# Patient Record
Sex: Male | Born: 1960 | Race: Black or African American | Hispanic: No | Marital: Single | Smoking: Former smoker
Health system: Southern US, Community
[De-identification: ages and names within clinical notes are randomized; demographics above are authoritative.]

## PROBLEM LIST (undated history)

## (undated) DIAGNOSIS — J309 Allergic rhinitis, unspecified: Secondary | ICD-10-CM

## (undated) DIAGNOSIS — I10 Essential (primary) hypertension: Secondary | ICD-10-CM

## (undated) HISTORY — DX: Essential (primary) hypertension: I10

## (undated) HISTORY — DX: Allergic rhinitis, unspecified: J30.9

---

## 2005-05-28 ENCOUNTER — Inpatient Hospital Stay: Payer: Self-pay | Admitting: Otolaryngology

## 2020-12-26 DIAGNOSIS — Z23 Encounter for immunization: Secondary | ICD-10-CM | POA: Diagnosis not present

## 2021-02-13 ENCOUNTER — Ambulatory Visit: Payer: Self-pay | Admitting: Internal Medicine

## 2021-02-13 ENCOUNTER — Encounter: Payer: Self-pay | Admitting: Internal Medicine

## 2021-02-13 VITALS — BP 164/90 | HR 103 | Temp 97.4°F | Resp 18 | Ht 71.0 in | Wt 139.5 lb

## 2021-02-13 DIAGNOSIS — R0602 Shortness of breath: Secondary | ICD-10-CM | POA: Insufficient documentation

## 2021-02-13 DIAGNOSIS — Z87891 Personal history of nicotine dependence: Secondary | ICD-10-CM

## 2021-02-13 DIAGNOSIS — J3489 Other specified disorders of nose and nasal sinuses: Secondary | ICD-10-CM

## 2021-02-13 DIAGNOSIS — R030 Elevated blood-pressure reading, without diagnosis of hypertension: Secondary | ICD-10-CM

## 2021-02-13 MED ORDER — ALBUTEROL SULFATE HFA 108 (90 BASE) MCG/ACT IN AERS: 2.0000 | INHALATION_SPRAY | Freq: Four times a day (QID) | RESPIRATORY_TRACT | 0 refills | Status: DC | PRN

## 2021-02-13 MED ORDER — FLUTICASONE PROPIONATE 50 MCG/ACT NA SUSP: 2.0000 | Freq: Every day | NASAL | 6 refills | Status: AC

## 2021-02-13 NOTE — Patient Instructions (Addendum)
It was great seeing you today!  Plan discussed at today's visit: -For nasal drainage, use Flonase (nasal steroid) two sprays in each nostril twice a day after using nasal saline -Albuterol inhaler sent to pharmacy to use as needed for wheezing or short of breath  -Referral placed to lung doctor for pulmonary function tests to rule out COPD/asthma  Follow up in: 1 month for physical and blood work  Take care and let us know if you have any questions or concerns prior to your next visit.  Dr. Caralee Ates

## 2021-02-13 NOTE — Progress Notes (Signed)
New Patient Office Visit  Subjective:  Patient ID: Dakota Blackwell, male    DOB: 02-Sep-1960  Age: 61 y.o. MRN: [id number]  CC:  Chief Complaint  Patient presents with   Establish Care   Shortness of Breath    Former smoker, off and on for several months   URI    Runny nose, blowing green out    HPI Dakota Blackwell presents as a new patient. No past medical history and no daily medications. 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 Did do PFTs in the past and was given an inhaler, but says was not diagnosed with a pulmonary disease - uncertain where or when this happened and no records in chart. Used to smoke 1ppd x 10 years, quit within the last few years. No diagnosis of COPD/asthma   Health Maintenance: -Blood work: due -Colon cancer screening: due   History reviewed. No pertinent past medical history.  History  reviewed. No pertinent surgical history.  Family History  Problem Relation Age of Onset   COPD Father    Cancer Father        bone    Social History   Socioeconomic History   Marital status: Single    Spouse name: Not on file   Number of children: Not on file   Years of education: Not on file   Highest education level: Not on file  Occupational History   Not on file  Tobacco Use   Smoking status: Former    Types: Cigarettes   Smokeless tobacco: Never  Vaping Use   Vaping Use: Never used  Substance and Sexual Activity   Alcohol use: Never   Drug use: Never   Sexual activity: Not on file  Other Topics Concern   Not on file  Social History Narrative   Not on file   Social Determinants of Health   Financial Resource Strain: Not on file  Food Insecurity: Not on file  Transportation Needs: Not on file  Physical Activity: Not on file  Stress: Not on file  Social Connections: Not on file  Intimate Partner Violence: Not on file    ROS Review of Systems  Constitutional:  Positive for appetite change. Negative for chills and fever.  HENT:  Positive for rhinorrhea. Negative for congestion, postnasal drip, sinus pressure, sinus pain, sneezing and sore throat.   Respiratory:  Positive for shortness of breath. Negative for cough and wheezing.   Cardiovascular:  Negative for chest pain, palpitations and leg swelling.  Gastrointestinal:  Negative for abdominal  pain, nausea and vomiting.  Skin: Negative.   Neurological:  Negative for dizziness and headaches.   Objective:   Today's Vitals: BP (!) 164/90    Pulse (!) 103    Temp (!) 97.4 F (36.3 C)    Resp 18    Ht 5\' 11"  (1.803 m)    Wt 139 lb 8 oz (63.3 kg)    SpO2 95%    BMI 19.46 kg/m   Physical Exam Constitutional:      Appearance: Normal appearance.  HENT:     Head: Normocephalic and atraumatic.     Nose: Rhinorrhea present.     Comments: Inferior turbinates swollen bilaterally     Mouth/Throat:     Mouth:  Mucous membranes are moist.     Comments: PND present Eyes:     Conjunctiva/sclera: Conjunctivae normal.  Cardiovascular:     Rate and Rhythm: Normal rate and regular rhythm.  Pulmonary:     Effort: Pulmonary effort is normal.     Comments: Mild inspiratory wheezes at the bilateral bases Abdominal:     General: There is no distension.     Palpations: Abdomen is soft.     Tenderness: There is no abdominal tenderness.  Musculoskeletal:     Right lower leg: No edema.     Left lower leg: No edema.  Skin:    General: Skin is warm and dry.  Neurological:     General: No focal deficit present.     Mental Status: He is alert. Mental status is at baseline.  Psychiatric:        Mood and Affect: Mood normal.        Behavior: Behavior normal.    Assessment & Plan:   1. Shortness of breath/History of tobacco use: Concerned for COPD/asthma. Referral placed for PFTs, in the meantime will sent Albuterol inhaler to use as needed for wheezing/shortness of breath. Due to his insurance, it will be costly for him to have blood work today, he will follow up  in 1 month for annual physical and blood work.   - Ambulatory referral to Pulmonology - albuterol (VENTOLIN HFA) 108 (90 Base) MCG/ACT inhaler; Inhale 2 puffs into the lungs every 6 (six) hours as needed for wheezing or shortness of breath.  Dispense: 8 g; Refill: 0  2. Rhinorrhea: Treat with nasal saline, steroid.   - fluticasone (FLONASE) 50 MCG/ACT nasal spray; Place 2 sprays into both nostrils daily.  Dispense: 16 g; Refill: 6  3. Elevated blood pressure reading: Elevated at 164/90, states in the past he had been on medications but this caused angioedema and he never followed up. Most likely has has long standing HTN, encouraged to check blood pressure at home and recheck at follow up where we will discuss starting a medication.    Follow-up: Return in about 4 weeks (around 03/13/2021) for physical exam CPE.   05/11/2021, DO

## 2021-03-16 ENCOUNTER — Other Ambulatory Visit: Payer: Self-pay

## 2021-03-16 ENCOUNTER — Encounter: Payer: Self-pay | Admitting: Internal Medicine

## 2021-03-16 ENCOUNTER — Ambulatory Visit (INDEPENDENT_AMBULATORY_CARE_PROVIDER_SITE_OTHER): Payer: Self-pay | Admitting: Internal Medicine

## 2021-03-16 VITALS — BP 182/90 | HR 120 | Temp 98.7°F | Resp 16 | Ht 71.0 in | Wt 139.0 lb

## 2021-03-16 DIAGNOSIS — I10 Essential (primary) hypertension: Secondary | ICD-10-CM

## 2021-03-16 DIAGNOSIS — R0602 Shortness of breath: Secondary | ICD-10-CM

## 2021-03-16 MED ORDER — AMLODIPINE BESYLATE 5 MG PO TABS: 5.0000 mg | ORAL_TABLET | Freq: Every day | ORAL | 3 refills | Status: DC

## 2021-03-16 NOTE — Patient Instructions (Addendum)
It was great seeing you today!  Plan discussed at today's visit: -Blood pressure medication sent to pharmacy, Amlodipine 5 mg daily -Please obtain a blood pressure cuff and start checking at home  Follow up in: 4 weeks for CPE  Take care and let us know if you have any questions or concerns prior to your next visit.  Dr. Caralee Ates  DASH Eating Plan DASH stands for Dietary Approaches to Stop Hypertension. The DASH eating plan is a healthy eating plan that has been shown to: Reduce high blood pressure (hypertension). Reduce your risk for type 2 diabetes, heart disease, and stroke. Help with weight loss. What are tips for following this plan? Reading food labels Check food labels for the amount of salt (sodium) per serving. Choose foods with less than 5 percent of the Daily Value of sodium. Generally, foods with less than 300 milligrams (mg) of sodium per serving fit into this eating plan. To find whole grains, look for the word "whole" as the first word in the ingredient list. Shopping Buy products labeled as "low-sodium" or "no salt added." Buy fresh foods. Avoid canned foods and pre-made or frozen meals. Cooking Avoid adding salt when cooking. Use salt-free seasonings or herbs instead of table salt or sea salt. Check with your health care provider or pharmacist before using salt substitutes. Do not fry foods. Cook foods using healthy methods such as baking, boiling, grilling, roasting, and broiling instead. Cook with heart-healthy oils, such as olive, canola, avocado, soybean, or sunflower oil. Meal planning Eat a balanced diet that includes: 4 or more servings of fruits and 4 or more servings of vegetables each day. Try to fill one-half of your plate with fruits and vegetables. 6-8 servings of whole grains each day. Less than 6 oz (170 g) of lean meat, poultry, or fish each day. A 3-oz (85-g) serving of meat is about the same size as a deck of cards. One egg equals 1 oz (28 g). 2-3  servings of low-fat dairy each day. One serving is 1 cup (237 mL). 1 serving of nuts, seeds, or beans 5 times each week. 2-3 servings of heart-healthy fats. Healthy fats called omega-3 fatty acids are found in foods such as walnuts, flaxseeds, fortified milks, and eggs. These fats are also found in cold-water fish, such as sardines, salmon, and mackerel. Limit how much you eat of: Canned or prepackaged foods. Food that is high in trans fat, such as some fried foods. Food that is high in saturated fat, such as fatty meat. Desserts and other sweets, sugary drinks, and other foods with added sugar. Full-fat dairy products. Do not salt foods before eating. Do not eat more than 4 egg yolks a week. Try to eat at least 2 vegetarian meals a week. Eat more home-cooked food and less restaurant, buffet, and fast food. Lifestyle When eating at a restaurant, ask that your food be prepared with less salt or no salt, if possible. If you drink alcohol: Limit how much you use to: 0-1 drink a day for women who are not pregnant. 0-2 drinks a day for men. Be aware of how much alcohol is in your drink. In the [place] one drink equals one 12 oz bottle of beer (355 mL), one 5 oz glass of wine (148 mL), or one 1 oz glass of hard liquor (44 mL). General information Avoid eating more than 2,300 mg of salt a day. If you have hypertension, you may need to reduce your sodium intake to 1,500 mg  a day. Work with your health care provider to maintain a healthy body weight or to lose weight. Ask what an ideal weight is for you. Get at least 30 minutes of exercise that causes your heart to beat faster (aerobic exercise) most days of the week. Activities may include walking, swimming, or biking. Work with your health care provider or dietitian to adjust your eating plan to your individual calorie needs. What foods should I eat? Fruits All fresh, dried, or frozen fruit. Canned fruit in natural juice (without added  sugar). Vegetables Fresh or frozen vegetables (raw, steamed, roasted, or grilled). Low-sodium or reduced-sodium tomato and vegetable juice. Low-sodium or reduced-sodium tomato sauce and tomato paste. Low-sodium or reduced-sodium canned vegetables. Grains Whole-grain or whole-wheat bread. Whole-grain or whole-wheat pasta. Brown rice. Orpah Cobb. Bulgur. Whole-grain and low-sodium cereals. Pita bread. Low-fat, low-sodium crackers. Whole-wheat flour tortillas. Meats and other proteins Skinless chicken or [place]. Ground chicken or [place]. Pork with fat trimmed off. Fish and seafood. Egg whites. Dried beans, peas, or lentils. Unsalted nuts, nut butters, and seeds. Unsalted canned beans. Lean cuts of beef with fat trimmed off. Low-sodium, lean precooked or cured meat, such as sausages or meat loaves. Dairy Low-fat (1%) or fat-free (skim) milk. Reduced-fat, low-fat, or fat-free cheeses. Nonfat, low-sodium ricotta or cottage cheese. Low-fat or nonfat yogurt. Low-fat, low-sodium cheese. Fats and oils Soft margarine without trans fats. Vegetable oil. Reduced-fat, low-fat, or light mayonnaise and salad dressings (reduced-sodium). Canola, safflower, olive, avocado, soybean, and sunflower oils. Avocado. Seasonings and condiments Herbs. Spices. Seasoning mixes without salt. Other foods Unsalted popcorn and pretzels. Fat-free sweets. The items listed above may not be a complete list of foods and beverages you can eat. Contact a dietitian for more information. What foods should I avoid? Fruits Canned fruit in a light or heavy syrup. Fried fruit. Fruit in cream or butter sauce. Vegetables Creamed or fried vegetables. Vegetables in a cheese sauce. Regular canned vegetables (not low-sodium or reduced-sodium). Regular canned tomato sauce and paste (not low-sodium or reduced-sodium). Regular tomato and vegetable juice (not low-sodium or reduced-sodium). Rosita Fire. Olives. Grains Baked goods made with fat, such  as croissants, muffins, or some breads. Dry pasta or rice meal packs. Meats and other proteins Fatty cuts of meat. Ribs. Fried meat. Tomasa Blase. Bologna, salami, and other precooked or cured meats, such as sausages or meat loaves. Fat from the back of a pig (fatback). Bratwurst. Salted nuts and seeds. Canned beans with added salt. Canned or smoked fish. Whole eggs or egg yolks. Chicken or [place] with skin. Dairy Whole or 2% milk, cream, and half-and-half. Whole or full-fat cream cheese. Whole-fat or sweetened yogurt. Full-fat cheese. Nondairy creamers. Whipped toppings. Processed cheese and cheese spreads. Fats and oils Butter. Stick margarine. Lard. Shortening. Ghee. Bacon fat. Tropical oils, such as coconut, palm kernel, or palm oil. Seasonings and condiments Onion salt, garlic salt, seasoned salt, table salt, and sea salt. Worcestershire sauce. Tartar sauce. Barbecue sauce. Teriyaki sauce. Soy sauce, including reduced-sodium. Steak sauce. Canned and packaged gravies. Fish sauce. Oyster sauce. Cocktail sauce. Store-bought horseradish. Ketchup. Mustard. Meat flavorings and tenderizers. Bouillon cubes. Hot sauces. Pre-made or packaged marinades. Pre-made or packaged taco seasonings. Relishes. Regular salad dressings. Other foods Salted popcorn and pretzels. The items listed above may not be a complete list of foods and beverages you should avoid. Contact a dietitian for more information. Where to find more information National Heart, Lung, and Blood Institute: PopSteam.is American Heart Association: www.heart.org Academy of Nutrition and Dietetics: www.eatright.org National  Kidney Foundation: www.kidney.org Summary The DASH eating plan is a healthy eating plan that has been shown to reduce high blood pressure (hypertension). It may also reduce your risk for type 2 diabetes, heart disease, and stroke. When on the DASH eating plan, aim to eat more fresh fruits and vegetables, whole grains, lean  proteins, low-fat dairy, and heart-healthy fats. With the DASH eating plan, you should limit salt (sodium) intake to 2,300 mg a day. If you have hypertension, you may need to reduce your sodium intake to 1,500 mg a day. Work with your health care provider or dietitian to adjust your eating plan to your individual calorie needs. This information is not intended to replace advice given to you by your health care provider. Make sure you discuss any questions you have with your health care provider. Document Revised: 12/26/2018 Document Reviewed: 12/26/2018 Elsevier Patient Education  2022 [employer].

## 2021-03-16 NOTE — Progress Notes (Signed)
Acute Office Visit  Subjective:    Patient ID: Dakota Blackwell, male    DOB: 18-Jan-1961, 61 y.o.   MRN: [id number]  Chief Complaint  Patient presents with   Annual Exam    HPI Patient is in today for blood pressure.  Hypertension: -Medications: None, had been on ACEI but had facial swelling  -Checking BP at home (average): has a cuff but not working  -Denies any SOB, CP, vision changes, LE edema or symptoms of hypotension -Diet: chicken, steak sometimes adds salt  -Exercise: no routine but active with walking   Shortness of breath: -Seeing Pulm on 03/30/21 for PFTs -Albuterol given at last visit is helping symptoms, taking about twice a week -No cough, no wheezes -Activities not limited  -Former smoker  -Denies orthopnea, PND, LE swelling  Family History  Problem Relation Age of Onset   COPD Father    Cancer Father        bone    Social History   Socioeconomic History   Marital status: Single    Spouse name: Not on file   Number of children: Not on file   Years of education: Not on file   Highest education level: Not on file  Occupational History   Not on file  Tobacco Use   Smoking status: Former    Types: Cigarettes   Smokeless tobacco: Never  Vaping Use   Vaping Use: Never used  Substance and Sexual Activity   Alcohol use: Never   Drug use: Never   Sexual activity: Not on file  Other Topics Concern   Not on file  Social History Narrative   Not on file   Social Determinants of Health   Financial Resource Strain: Low Risk    Difficulty of Paying Living Expenses: Not hard at all  Food Insecurity: No Food Insecurity   Worried About Charity fundraiser in the Last Year: Never true   Ran Out of Food in the Last Year: Never true  Transportation Needs: No Transportation Needs   Lack of Transportation (Medical): No   Lack of Transportation (Non-Medical): No  Physical Activity: Inactive   Days of Exercise per Week: 0 days   Minutes of Exercise per  Session: 0 min  Stress: No Stress Concern Present   Feeling of Stress : Not at all  Social Connections: Unknown   Frequency of Communication with Friends and Family: Twice a week   Frequency of Social Gatherings with Friends and Family: Three times a week   Attends Religious Services: Not on file   Active Member of Clubs or Organizations: No   Attends Archivist Meetings: Never   Marital Status: Not on file  Intimate Partner Violence: Not At Risk   Fear of Current or Ex-Partner: No   Emotionally Abused: No   Physically Abused: No   Sexually Abused: No    Outpatient Medications Prior to Visit  Medication Sig Dispense Refill   albuterol (VENTOLIN HFA) 108 (90 Base) MCG/ACT inhaler Inhale 2 puffs into the lungs every 6 (six) hours as needed for wheezing or shortness of breath. 8 g 0   fluticasone (FLONASE) 50 MCG/ACT nasal spray Place 2 sprays into both nostrils daily. 16 g 6   No facility-administered medications prior to visit.    Allergies  Allergen Reactions   Ace Inhibitors Swelling    Review of Systems  Constitutional:  Negative for chills and fever.  Eyes:  Negative for visual disturbance.  Respiratory:  Positive  for shortness of breath. Negative for cough and wheezing.   Cardiovascular:  Negative for chest pain, palpitations and leg swelling.  Neurological:  Negative for dizziness and headaches.      Objective:    Physical Exam Constitutional:      Appearance: Normal appearance.  HENT:     Head: Normocephalic and atraumatic.  Eyes:     Conjunctiva/sclera: Conjunctivae normal.  Cardiovascular:     Rate and Rhythm: Normal rate and regular rhythm.  Pulmonary:     Effort: Pulmonary effort is normal.     Breath sounds: Normal breath sounds.  Musculoskeletal:     Right lower leg: No edema.     Left lower leg: No edema.  Skin:    General: Skin is warm and dry.  Neurological:     General: No focal deficit present.     Mental Status: He is alert.  Mental status is at baseline.  Psychiatric:        Mood and Affect: Mood normal.        Behavior: Behavior normal.    BP (!) 182/90    Pulse (!) 120    Temp 98.7 F (37.1 C)    Resp 16    Ht 5' 11"  (1.803 m)    Wt 139 lb (63 kg)    SpO2 96%    BMI 19.39 kg/m  Wt Readings from Last 3 Encounters:  03/16/21 139 lb (63 kg)  02/13/21 139 lb 8 oz (63.3 kg)    Health Maintenance Due  Topic Date Due   HIV Screening  Never done   Hepatitis C Screening  Never done    There are no preventive care reminders to display for this patient.   No results found for: TSH No results found for: WBC, HGB, HCT, MCV, PLT No results found for: NA, K, CHLORIDE, CO2, GLUCOSE, BUN, CREATININE, BILITOT, ALKPHOS, AST, ALT, PROT, ALBUMIN, CALCIUM, ANIONGAP, EGFR, GFR No results found for: CHOL No results found for: HDL No results found for: LDLCALC No results found for: TRIG No results found for: CHOLHDL No results found for: HGBA1C     Assessment & Plan:   1. Hypertension, unspecified type: History of facial swelling with ACE inhibitor, would prefer a diuretic but no blood work done with uncertain kidney function, patient declining labs today. Will start Amlodipine 5 mg. Patient will obtain a blood pressure cuff and will start checking blood pressure at home and bring to follow up.   - amLODipine (NORVASC) 5 MG tablet; Take 1 tablet (5 mg total) by mouth daily.  Dispense: 90 tablet; Refill: 3  2. Shortness of breath: Appears to be doing well with Albuterol, suspect COPD, planning on PFTs on 03/30/21.    Teodora Medici, DO

## 2021-03-16 NOTE — Progress Notes (Deleted)
Name: Dakota Blackwell   MRN: [id number]    DOB: 28-May-1960   Date:03/16/2021       Progress Note  Subjective  Chief Complaint  Chief Complaint  Patient presents with   Annual Exam    HPI  Patient presents for annual CPE.  IPSS Questionnaire (AUA-7): Over the past month   1)  How often have you had a sensation of not emptying your bladder completely after you finish urinating?  {Rating:19227}  2)  How often have you had to urinate again less than two hours after you finished urinating? {Rating:19227}  3)  How often have you found you stopped and started again several times when you urinated?  {Rating:19227}  4) How difficult have you found it to postpone urination?  {Rating:19227}  5) How often have you had a weak urinary stream?  {Rating:19227}  6) How often have you had to push or strain to begin urination?  {Rating:19227}  7) How many times did you most typically get up to urinate from the time you went to bed until the time you got up in the morning?  {Rating:19228}  Total score:  0-7 mildly symptomatic   8-19 moderately symptomatic   20-35 severely symptomatic     Diet: *** Exercise: ***  Depression: phq 9 is {gen pos [id number]} Depression screen [facility] 2/9 03/16/2021 02/13/2021  Decreased Interest 0 0  Down, Depressed, Hopeless 0 0  PHQ - 2 Score 0 0  Altered sleeping 0 0  Tired, decreased energy 0 0  Change in appetite 0 0  Feeling bad or failure about yourself  0 0  Trouble concentrating 0 0  Moving slowly or fidgety/restless 0 0  Suicidal thoughts 0 0  PHQ-9 Score 0 0  Difficult doing work/chores Not difficult at all Not difficult at all    Hypertension:  BP Readings from Last 3 Encounters:  03/16/21 (!) 182/90  02/13/21 (!) 164/90    Obesity: Wt Readings from Last 3 Encounters:  03/16/21 139 lb (63 kg)  02/13/21 139 lb 8 oz (63.3 kg)   BMI Readings from Last 3 Encounters:  03/16/21 19.39 kg/m  02/13/21 19.46 kg/m     Lipids:  No results found for:  CHOL No results found for: HDL No results found for: LDLCALC No results found for: TRIG No results found for: CHOLHDL No results found for: LDLDIRECT Glucose:  No results found for: GLUCOSE, GLUCAP   ***  Single STD testing and prevention (HIV/chl/gon/syphilis):  {yes/no/default n/a:21102::"not applicable"} Hep C Screening:  Skin cancer: Discussed monitoring for atypical lesions Colorectal cancer: *** Prostate cancer:  {yes/no/default n/a:21102::"not applicable"} No results found for: PSA   Lung cancer:  Low Dose CT Chest recommended if Age 30-80 years, 30 pack-year currently smoking OR have quit w/in 15years. Patient  {Response; yes/no/na:63} AAA: The USPSTF recommends one-time screening with ultrasonography in men ages 72 to 75 years who have ever smoked. Patient:  {Response; yes/no/na:63} ECG:  ***  Vaccines:   HPV:  Tdap:  Shingrix:  Pneumonia:  Flu:  COVID-19:  Advanced Care Planning: A voluntary discussion about advance care planning including the explanation and discussion of advance directives.  Discussed health care proxy and Living will, and the patient was able to identify a health care proxy as ***.  Patient {DOES_DOES WLS:93734}   Patient Active Problem List   Diagnosis Date Noted   History of tobacco use 02/13/2021   Shortness of breath 02/13/2021    No past surgical history on file.  Family History  Problem Relation Age of Onset   COPD Father    Cancer Father        bone    Social History   Socioeconomic History   Marital status: Single    Spouse name: Not on file   Number of children: Not on file   Years of education: Not on file   Highest education level: Not on file  Occupational History   Not on file  Tobacco Use   Smoking status: Former    Types: Cigarettes   Smokeless tobacco: Never  Vaping Use   Vaping Use: Never used  Substance and Sexual Activity   Alcohol use: Never   Drug use: Never   Sexual activity: Not on file  Other  Topics Concern   Not on file  Social History Narrative   Not on file   Social Determinants of Health   Financial Resource Strain: Low Risk    Difficulty of Paying Living Expenses: Not hard at all  Food Insecurity: No Food Insecurity   Worried About Programme researcher, broadcasting/film/video in the Last Year: Never true   Ran Out of Food in the Last Year: Never true  Transportation Needs: No Transportation Needs   Lack of Transportation (Medical): No   Lack of Transportation (Non-Medical): No  Physical Activity: Inactive   Days of Exercise per Week: 0 days   Minutes of Exercise per Session: 0 min  Stress: No Stress Concern Present   Feeling of Stress : Not at all  Social Connections: Unknown   Frequency of Communication with Friends and Family: Twice a week   Frequency of Social Gatherings with Friends and Family: Three times a week   Attends Religious Services: Not on file   Active Member of Clubs or Organizations: No   Attends Banker Meetings: Never   Marital Status: Not on file  Intimate Partner Violence: Not At Risk   Fear of Current or Ex-Partner: No   Emotionally Abused: No   Physically Abused: No   Sexually Abused: No     Current Outpatient Medications:    albuterol (VENTOLIN HFA) 108 (90 Base) MCG/ACT inhaler, Inhale 2 puffs into the lungs every 6 (six) hours as needed for wheezing or shortness of breath., Disp: 8 g, Rfl: 0   fluticasone (FLONASE) 50 MCG/ACT nasal spray, Place 2 sprays into both nostrils daily., Disp: 16 g, Rfl: 6  Allergies  Allergen Reactions   Ace Inhibitors Swelling     ROS  ***   Objective  Vitals:   03/16/21 0925  BP: (!) 182/90  Pulse: (!) 120  Resp: 16  Temp: 98.7 F (37.1 C)  SpO2: 96%  Weight: 139 lb (63 kg)  Height: 5\' 11"  (1.803 m)    Body mass index is 19.39 kg/m.  Physical Exam ***  No results found for this or any previous visit (from the past 2160 hour(s)).   Fall Risk: Fall Risk  03/16/2021 02/13/2021  Falls in the  past year? 0 0  Number falls in past yr: 0 0  Injury with Fall? 0 0     Functional Status Survey: Is the patient deaf or have difficulty hearing?: No Does the patient have difficulty seeing, even when wearing glasses/contacts?: No Does the patient have difficulty concentrating, remembering, or making decisions?: No Does the patient have difficulty walking or climbing stairs?: No Does the patient have difficulty dressing or bathing?: No Does the patient have difficulty doing errands alone such as visiting  a doctor's office or shopping?: No    Assessment & Plan  There are no diagnoses linked to this encounter.   -Prostate cancer screening and PSA options (with potential risks and benefits of testing vs not testing) were discussed along with recent recs/guidelines. -USPSTF grade A and B recommendations reviewed with patient; age-appropriate recommendations, preventive care, screening tests, etc discussed and encouraged; healthy living encouraged; see AVS for patient education given to patient -Discussed importance of 150 minutes of physical activity weekly, eat two servings of fish weekly, eat one serving of tree nuts ( cashews, pistachios, pecans, almonds.Marland Kitchen) every other day, eat 6 servings of fruit/vegetables daily and drink plenty of water and avoid sweet beverages.  -Reviewed Health Maintenance: {yes/no/default n/a:21102::"not applicable"}

## 2021-03-27 ENCOUNTER — Institutional Professional Consult (permissible substitution): Payer: Self-pay | Admitting: Pulmonary Disease

## 2021-04-10 ENCOUNTER — Other Ambulatory Visit: Payer: Self-pay | Admitting: Internal Medicine

## 2021-04-10 ENCOUNTER — Ambulatory Visit (INDEPENDENT_AMBULATORY_CARE_PROVIDER_SITE_OTHER): Payer: Self-pay | Admitting: Pulmonary Disease

## 2021-04-10 ENCOUNTER — Encounter: Payer: Self-pay | Admitting: Pulmonary Disease

## 2021-04-10 ENCOUNTER — Other Ambulatory Visit: Payer: Self-pay

## 2021-04-10 VITALS — BP 162/88 | HR 100 | Temp 98.3°F | Ht 71.0 in | Wt 139.0 lb

## 2021-04-10 DIAGNOSIS — J309 Allergic rhinitis, unspecified: Secondary | ICD-10-CM | POA: Insufficient documentation

## 2021-04-10 DIAGNOSIS — R0602 Shortness of breath: Secondary | ICD-10-CM

## 2021-04-10 DIAGNOSIS — Z87891 Personal history of nicotine dependence: Secondary | ICD-10-CM

## 2021-04-10 DIAGNOSIS — J45901 Unspecified asthma with (acute) exacerbation: Secondary | ICD-10-CM

## 2021-04-10 MED ORDER — ALBUTEROL SULFATE HFA 108 (90 BASE) MCG/ACT IN AERS
2.0000 | INHALATION_SPRAY | Freq: Four times a day (QID) | RESPIRATORY_TRACT | 6 refills | Status: DC | PRN
Start: 2021-04-10 — End: 2021-04-11

## 2021-04-10 MED ORDER — PREDNISONE 10 MG (21) PO TBPK: ORAL_TABLET | ORAL | 0 refills | Status: DC

## 2021-04-10 MED ORDER — TRELEGY ELLIPTA 200-62.5-25 MCG/ACT IN AEPB: 1.0000 | INHALATION_SPRAY | Freq: Every day | RESPIRATORY_TRACT | 0 refills | Status: DC

## 2021-04-10 NOTE — Progress Notes (Signed)
? ?Subjective:  ? ? Patient ID: Dakota Blackwell, male    DOB: 09-Aug-1960, 61 y.o.   MRN: [id number] ?Patient Care Team: ?Margarita Mail, DO as PCP - General (Internal Medicine) ? ?Chief Complaint  ?Patient presents with  ? pulmonary consult  ?  Per Dr. Caralee Ates- sob with exertion, prod cough with green sputum and wheezing.   ? ?HPI ?Dakota Blackwell is a 61 year old remote former smoker who presents for evaluation of shortness of breath of 1 to 2 years duration, worse over the last 6 months.  He is kindly referred by Dr. Margarita Mail.  The patient states that shortness of breath has been very limiting to him and he had to stop working due to this.  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  He notes occasional cough that has been for the most part nonproductive but when it is productive he does have some thick yellowish sputum.  He notes seasonal variation with his shortness of breath being worse in the spring and fall of the year.  He notes that walking and lifting objects makes his breathing worse, resting alleviates shortness of breath somewhat.  Approximately a month or 2 ago he got an albuterol inhaler which he has been using and he notes that this has helped his shortness of breath.  The effect however is not lasting.  He notes also seasonal allergies with nasal congestion and sinus pressure during allergy season.  He does not endorse nocturnal awakenings.  Does have chest tightness but not chest pain.  Chest tightness occurs with a shortness of breath.  No gastroesophageal reflux symptoms.  No lower extremity edema.  No calf tenderness.  No fevers, chills or sweats.  No other symptomatology. ? ?Patient does not have any prior history of asthma.  He was not sickly as a child.  He has no prior military history. ? ?Review of Systems ?A 10 point review of systems was performed and it is as noted above otherwise negative. ? ?Past Medical History:  ?Diagnosis Date  ? Allergic rhinitis   ?  Hypertension   ? ?No past surgical history on file. ? ?Patient Active Problem List  ? Diagnosis Date Noted  ? History of tobacco use 02/13/2021  ? Shortness of breath 02/13/2021  ? ?Family History  ?Problem Relation Age of Onset  ? COPD Father   ? Cancer Father   ?     bone  ? ?Social History  ? ?Tobacco Use  ? Smoking status: Former  ?  Packs/day: 0.50  ?  Years: 15.00  ?  Pack years: 7.50  ?  Types: Cigarettes  ?  Quit date: 2013  ?  Years since quitting: 10.1  ? Smokeless tobacco: Never  ?Substance Use Topics  ? Alcohol use: Never  ? ?Allergies  ?Allergen Reactions  ? Ace Inhibitors Swelling  ? ?Current Meds  ?Medication Sig  ? albuterol (VENTOLIN HFA) 108 (90 Base) MCG/ACT inhaler Inhale 2 puffs into the lungs every 6 (six) hours as needed for wheezing or shortness of breath.  ? amLODipine (NORVASC) 5 MG tablet Take 1 tablet (5 mg total) by mouth daily.  ? fluticasone (FLONASE) 50 MCG/ACT nasal spray Place 2 sprays into both nostrils daily.  ? ?Immunization History  ?Administered Date(s) Administered  ? Influenza,inj,Quad PF,6+ Mos 12/30/2019, 12/26/2020  ? Janssen (J&J) SARS-COV-2 Vaccination 05/18/2019, 12/30/2019  ? Research officer, trade [employer] 18yrs & up 12/26/2020  ? ? ?   ?Objective:  ?  Physical Exam ?BP (!) 160/88 (BP Location: Left Arm, Cuff Size: Normal) Comment: MD aware  Pulse 100   Temp 98.3 ?F (36.8 ?C) (Temporal)   Ht 5\' 11"  (1.803 m)   Wt 139 lb (63 kg)   SpO2 94%   BMI 19.39 kg/m? ' ?GENERAL: Well-developed, well-nourished gentleman, no acute distress.  He does have use of accessories.  Fully ambulatory.  Mild conversational dyspnea. ?HEAD: Normocephalic, atraumatic.  ?EYES: Pupils equal, round, reactive to light.  No scleral icterus.  ?MOUTH: Nose/mouth/throat not examined due to masking requirements for COVID 19. ?NECK: Supple. No thyromegaly. Trachea midline. No JVD.  No adenopathy. ?PULMONARY: Symmetrical breath sounds, poor air movement, faint end expiratory wheezes  throughout, I to E ratio 1:4 (prolonged expiratory phase). ?CARDIOVASCULAR: S1 and S2.  Tachycardic, regular rhythm, no rubs murmurs or gallops heard. ?ABDOMEN: Benign. ?MUSCULOSKELETAL: No joint deformity, no clubbing, no edema.  ?NEUROLOGIC: No overt focal deficit, no gait disturbance, speech is fluent. ?SKIN: Intact,warm,dry. ?PSYCH: Anxious mood, behavior normal ? ?   ?Assessment & Plan:  ? ?  ICD-10-CM   ?1. Shortness of breath  R06.02 DG Chest 2 View  ?  Pulmonary Function Test [facility] Only  ? Suspect asthma, currently with acute exacerbation ?Obtain chest x-ray, PFTs ?Management as below  ?  ?2. Persistent asthma with acute exacerbation, unspecified asthma severity  J45.901   ? Prednisone taper pack ?Renew albuterol for as needed use ?Trelegy 200, 1 puff daily-samples provided  ?  ?3. Former light cigarette smoker (1-9 per day)  Z87.891   ? Minimal exposure ?Quit 2013, estimated 7.5 PY  ?  ? ?Orders Placed This Encounter  ?Procedures  ? DG Chest 2 View  ?  Standing Status:   Future  ?  Standing Expiration Date:   10/11/2021  ?  Order Specific Question:   Reason for Exam (SYMPTOM  OR DIAGNOSIS REQUIRED)  ?  Answer:   SOB  ?  Order Specific Question:   Preferred imaging location?  ?  Answer:   [facility]  ? Pulmonary Function Test [facility] Only  ?  Standing Status:   Future  ?  Standing Expiration Date:   04/11/2022  ?  Scheduling Instructions:  ?   Next available.  ?  Order Specific Question:   Full PFT: includes the following: basic spirometry, spirometry pre & post bronchodilator, diffusion capacity (DLCO), lung volumes  ?  Answer:   Full PFT  ? ?Meds ordered this encounter  ?Medications  ? Fluticasone-Umeclidin-Vilant (TRELEGY ELLIPTA) 200-62.5-25 MCG/ACT AEPB  ?  Sig: Inhale 1 puff into the lungs daily.  ?  Dispense:  14 each  ?  Refill:  0  ?  Order Specific Question:   Lot Number?  ?  Answer:   [id number]  ?  Order Specific Question:   Expiration Date?  ?  Answer:   07/07/2022  ?  Order Specific Question:    Manufacturer?  ?  Answer:   [employer] [12]  ?  Order Specific Question:   NDC  ?  Answer8/02/2022 [3]  ?  Order Specific Question:   Quantity  ?  Answer:   3  ? predniSONE (STERAPRED UNI-PAK 21 TAB) 10 MG (21) TBPK tablet  ?  Sig: Take as directed in the package.  ?  Dispense:  21 tablet  ?  Refill:  0  ? albuterol (VENTOLIN HFA) 108 (90 Base) MCG/ACT inhaler  ?  Sig: Inhale 2 puffs into the lungs every 6 (  six) hours as needed for wheezing or shortness of breath.  ?  Dispense:  8 g  ?  Refill:  6  ? ?Patient likely has severe persistent asthma, appears to be having an exacerbation at present.  We will treat with prednisone taper.  He will be placed on Trelegy Ellipta 200 as maintenance 1 puff daily, he was instructed to rinse his mouth well after use.  We did refill his albuterol, the patient was taught how to use both metered-dose inhaler and dry powder inhaler. ? ?We have ordered a chest x-ray and PFTs.  Patient will be seen in follow-up in 3 to 4 weeks time with either me or the nurse practitioner at that time. ? ? ?C. Danice Goltz, MD ?Advanced Bronchoscopy ?[facility] ? ? ? ?*This note was dictated using voice recognition software/Dragon.  Despite best efforts to proofread, errors can occur which can change the meaning. Any transcriptional errors that result from this process are unintentional and may not be fully corrected at the time of dictation. ?

## 2021-04-10 NOTE — Patient Instructions (Signed)
I think you have asthma.  Today you had quite a bit of wheezing when I examined you. ? ?I have sent to your pharmacy a medication called prednisone that you will take as it is directed in the package.  You will be decreasing the number of pills that you take per day, this is what we call a taper package.  This to open up your lungs and make it easier for you to breathe. ? ?You may use your emergency inhaler (albuterol) up to 4 times a day if you needed.  Make sure you carry that inhaler with you at all times. ? ?We are giving you an inhaler that you use every day whether you think you need it or not this inhaler is called TRELEGY.  This will make it so you do not have to use your emergency inhaler so much.  Make sure you rinse your mouth well after you use the TRELEGY. ? ?We are ordering some breathing tests and a chest x-ray. ? ?We will see you back in 3 to 4 weeks time.  You will see me or one of our nurse practitioners.  Please call before the your follow-up appointment if you have any new breathing problems. ?

## 2021-04-11 MED ORDER — ALBUTEROL SULFATE HFA 108 (90 BASE) MCG/ACT IN AERS: 2.0000 | INHALATION_SPRAY | Freq: Four times a day (QID) | RESPIRATORY_TRACT | 6 refills | Status: DC | PRN

## 2021-04-11 NOTE — Addendum Note (Signed)
Addended by: Davene Costain on: 04/11/2021 02:05 PM ? ? Modules accepted: Orders ? ?

## 2021-04-11 NOTE — Telephone Encounter (Signed)
Duplicate request. ?Requested Prescriptions  ?Pending Prescriptions Disp Refills  ?? albuterol (VENTOLIN HFA) 108 (90 Base) MCG/ACT inhaler [Pharmacy Med Name: ALBUTEROL HFA INH (200 PUFFS) 6.7GM] 6.7 g   ?  Sig: INHALE 2 PUFFS INTO THE LUNGS EVERY 6 HOURS AS NEEDED FOR WHEEZING OR SHORTNESS OF BREATH  ?  ? Pulmonology:  Beta Agonists 2 Failed - 04/10/2021  8:33 AM  ?  ?  Failed - Last BP in normal range  ?  BP Readings from Last 1 Encounters:  ?04/10/21 (!) 162/88  ?   ?  ?  Failed - Last Heart Rate in normal range  ?  Pulse Readings from Last 1 Encounters:  ?04/10/21 100  ?   ?  ?  Passed - Valid encounter within last 12 months  ?  Recent Outpatient Visits   ?      ? 3 weeks ago Hypertension, unspecified type  ? [facility] Margarita Mail, DO  ? 1 month ago Shortness of breath  ? [facility] Margarita Mail, DO  ?  ?  ?Future Appointments   ?        ? In 2 days Margarita Mail, DO [facility]  ?  ? ?  ?  ?  ? ? ? ?

## 2021-04-13 ENCOUNTER — Other Ambulatory Visit: Payer: Self-pay

## 2021-04-13 ENCOUNTER — Encounter: Payer: Self-pay | Admitting: Internal Medicine

## 2021-04-13 ENCOUNTER — Ambulatory Visit (INDEPENDENT_AMBULATORY_CARE_PROVIDER_SITE_OTHER): Payer: Medicaid Other | Admitting: Internal Medicine

## 2021-04-13 VITALS — BP 174/84 | HR 122 | Temp 98.5°F | Resp 16 | Ht 71.0 in | Wt 140.3 lb

## 2021-04-13 DIAGNOSIS — Z125 Encounter for screening for malignant neoplasm of prostate: Secondary | ICD-10-CM

## 2021-04-13 DIAGNOSIS — Z114 Encounter for screening for human immunodeficiency virus [HIV]: Secondary | ICD-10-CM

## 2021-04-13 DIAGNOSIS — Z13220 Encounter for screening for lipoid disorders: Secondary | ICD-10-CM

## 2021-04-13 DIAGNOSIS — Z1211 Encounter for screening for malignant neoplasm of colon: Secondary | ICD-10-CM

## 2021-04-13 DIAGNOSIS — I10 Essential (primary) hypertension: Secondary | ICD-10-CM | POA: Insufficient documentation

## 2021-04-13 DIAGNOSIS — Z1159 Encounter for screening for other viral diseases: Secondary | ICD-10-CM

## 2021-04-13 DIAGNOSIS — Z0001 Encounter for general adult medical examination with abnormal findings: Secondary | ICD-10-CM | POA: Diagnosis not present

## 2021-04-13 MED ORDER — AMLODIPINE BESYLATE 10 MG PO TABS: 10.0000 mg | ORAL_TABLET | Freq: Every day | ORAL | 0 refills | Status: DC

## 2021-04-13 NOTE — Progress Notes (Signed)
Name: Dakota Blackwell   MRN: [id number]    DOB: 1960-06-28   Date:04/13/2021       Progress Note  Subjective  Chief Complaint  Chief Complaint  Patient presents with   Annual Exam    HPI  Patient presents for annual CPE.  Hypertension: -Medications: Amlodipine 5 -Patient is compliant with above medications and reports no side effects. -Checking BP at home (average): checked once last week and still high 160/90 -Denies any SOB, CP, vision changes, LE edema or symptoms of hypotension  IPSS Questionnaire (AUA-7): Over the past month   1)  How often have you had a sensation of not emptying your bladder completely after you finish urinating?  0 - Not at all  2)  How often have you had to urinate again less than two hours after you finished urinating? 1 - Less than 1 time in 5  3)  How often have you found you stopped and started again several times when you urinated?  1 - Less than 1 time in 5  4) How difficult have you found it to postpone urination?  0 - Not at all  5) How often have you had a weak urinary stream?  0 - Not at all  6) How often have you had to push or strain to begin urination?  0 - Not at all  7) How many times did you most typically get up to urinate from the time you went to bed until the time you got up in the morning?  2 - 2 times  Total score:  0-7 mildly symptomatic   8-19 moderately symptomatic   20-35 severely symptomatic     Diet: eating a lot of baked foods Exercise: no  Depression: phq 9 is negative Depression screen [facility] 2/9 04/13/2021 03/16/2021 02/13/2021  Decreased Interest 0 0 0  Down, Depressed, Hopeless 0 0 0  PHQ - 2 Score 0 0 0  Altered sleeping 0 0 0  Tired, decreased energy 0 0 0  Change in appetite 0 0 0  Feeling bad or failure about yourself  0 0 0  Trouble concentrating 0 0 0  Moving slowly or fidgety/restless 0 0 0  Suicidal thoughts 0 0 0  PHQ-9 Score 0 0 0  Difficult doing work/chores Not difficult at all Not difficult at all Not  difficult at all    Hypertension:  BP Readings from Last 3 Encounters:  04/10/21 (!) 162/88  03/16/21 (!) 182/90  02/13/21 (!) 164/90    Obesity: Wt Readings from Last 3 Encounters:  04/10/21 139 lb (63 kg)  03/16/21 139 lb (63 kg)  02/13/21 139 lb 8 oz (63.3 kg)   BMI Readings from Last 3 Encounters:  04/10/21 19.39 kg/m  03/16/21 19.39 kg/m  02/13/21 19.46 kg/m     Lipids:  No results found for: CHOL No results found for: HDL No results found for: LDLCALC No results found for: TRIG No results found for: CHOLHDL No results found for: LDLDIRECT Glucose:  No results found for: GLUCOSE, GLUCAP   Single STD testing and prevention (HIV/chl/gon/syphilis):  no Hep C Screening: due Skin cancer: Discussed monitoring for atypical lesions Colorectal cancer: Due Prostate cancer:  yes No results found for: PSA  Lung cancer:  Low Dose CT Chest recommended if Age 68-80 years, 30 pack-year currently smoking OR have quit w/in 15years. Patient  not applicable Quit > 15 years ago  AAA: The USPSTF recommends one-time screening with ultrasonography in men ages 53  to 75 years who have ever smoked. Patient:  not applicable ECG:  n/a  Vaccines:   HPV: n/a Tdap: due, will go to health department  Shingrix: discussed, can get at pharmacy  Pneumonia: due, will do at follow up Flu: UTD COVID-19: 3 vaccines   Advanced Care Planning: A voluntary discussion about advance care planning including the explanation and discussion of advance directives.  Discussed health care proxy and Living will, and the patient was able to identify a health care proxy as Dakota Blackwell (wife).  Patient does not have a living will.   Patient Active Problem List   Diagnosis Date Noted   Allergic rhinitis 04/10/2021   History of tobacco use 02/13/2021   Shortness of breath 02/13/2021    No past surgical history on file.  Family History  Problem Relation Age of Onset   COPD Father    Cancer Father         bone    Social History   Socioeconomic History   Marital status: Single    Spouse name: Not on file   Number of children: Not on file   Years of education: Not on file   Highest education level: Not on file  Occupational History   Not on file  Tobacco Use   Smoking status: Former    Packs/day: 0.50    Years: 15.00    Pack years: 7.50    Types: Cigarettes    Quit date: 2013    Years since quitting: 10.1   Smokeless tobacco: Never  Vaping Use   Vaping Use: Never used  Substance and Sexual Activity   Alcohol use: Never   Drug use: Never   Sexual activity: Not on file  Other Topics Concern   Not on file  Social History Narrative   Not on file   Social Determinants of Health   Financial Resource Strain: Low Risk    Difficulty of Paying Living Expenses: Not hard at all  Food Insecurity: No Food Insecurity   Worried About Programme researcher, broadcasting/film/video in the Last Year: Never true   Ran Out of Food in the Last Year: Never true  Transportation Needs: No Transportation Needs   Lack of Transportation (Medical): No   Lack of Transportation (Non-Medical): No  Physical Activity: Inactive   Days of Exercise per Week: 0 days   Minutes of Exercise per Session: 0 min  Stress: No Stress Concern Present   Feeling of Stress : Not at all  Social Connections: Unknown   Frequency of Communication with Friends and Family: Twice a week   Frequency of Social Gatherings with Friends and Family: Three times a week   Attends Religious Services: Not on file   Active Member of Clubs or Organizations: No   Attends Banker Meetings: Never   Marital Status: Not on file  Intimate Partner Violence: Not At Risk   Fear of Current or Ex-Partner: No   Emotionally Abused: No   Physically Abused: No   Sexually Abused: No     Current Outpatient Medications:    albuterol (VENTOLIN HFA) 108 (90 Base) MCG/ACT inhaler, Inhale 2 puffs into the lungs every 6 (six) hours as needed for wheezing  or shortness of breath., Disp: 8 g, Rfl: 6   amLODipine (NORVASC) 5 MG tablet, Take 1 tablet (5 mg total) by mouth daily., Disp: 90 tablet, Rfl: 3   fluticasone (FLONASE) 50 MCG/ACT nasal spray, Place 2 sprays into both nostrils daily., Disp: 16 g,  Rfl: 6   Fluticasone-Umeclidin-Vilant (TRELEGY ELLIPTA) 200-62.5-25 MCG/ACT AEPB, Inhale 1 puff into the lungs daily., Disp: 14 each, Rfl: 0   predniSONE (STERAPRED UNI-PAK 21 TAB) 10 MG (21) TBPK tablet, Take as directed in the package., Disp: 21 tablet, Rfl: 0  Allergies  Allergen Reactions   Ace Inhibitors Swelling     Review of Systems  Constitutional: Negative.   Respiratory: Negative.    Cardiovascular: Negative.   Gastrointestinal: Negative.   Genitourinary: Negative.      Objective  Vitals:   04/13/21 1457 04/13/21 1509  BP: (!) 178/90 (!) 174/84  Pulse: (!) 122   Resp: 16   Temp: 98.5 F (36.9 C)   TempSrc: Oral   SpO2: 96%   Weight: 140 lb 4.8 oz (63.6 kg)   Height: 5\' 11"  (1.803 m)     Body mass index is 19.57 kg/m.  Physical Exam Constitutional:      Appearance: Normal appearance.  HENT:     Head: Normocephalic and atraumatic.  Eyes:     Conjunctiva/sclera: Conjunctivae normal.  Cardiovascular:     Rate and Rhythm: Normal rate and regular rhythm.  Pulmonary:     Effort: Pulmonary effort is normal.     Breath sounds: Normal breath sounds.  Skin:    General: Skin is warm and dry.  Neurological:     General: No focal deficit present.     Mental Status: He is alert. Mental status is at baseline.  Psychiatric:        Mood and Affect: Mood normal.        Behavior: Behavior normal.    No results found for this or any previous visit (from the past 2160 hour(s)).   Fall Risk: Fall Risk  04/13/2021 03/16/2021 02/13/2021  Falls in the past year? 0 0 0  Number falls in past yr: 0 0 0  Injury with Fall? 0 0 0  Risk for fall due to : No Fall Risks - -    Assessment & Plan  1. Encounter for general adult  medical examination with abnormal findings/Need for hepatitis C screening test/Encounter for screening for HIV/Prostate cancer screening/Lipid screening  - CBC w/Diff/Platelet - COMPLETE METABOLIC PANEL WITH GFR - Lipid Profile - PSA - Hepatitis C Antibody - HIV antibody (with reflex)  2. Colon cancer screening  - Cologuard  3. Hypertension, unspecified type: Blood pressure still uncontrolled, increase Amlodipine to 10 mg, once kidney function can be assess another medication can be added. Follow up in 1 month for recheck.  - CBC w/Diff/Platelet - COMPLETE METABOLIC PANEL WITH GFR - amLODipine (NORVASC) 10 MG tablet; Take 1 tablet (10 mg total) by mouth daily.  Dispense: 90 tablet; Refill: 0   -Prostate cancer screening and PSA options (with potential risks and benefits of testing vs not testing) were discussed along with recent recs/guidelines. -USPSTF grade A and B recommendations reviewed with patient; age-appropriate recommendations, preventive care, screening tests, etc discussed and encouraged; healthy living encouraged; see AVS for patient education given to patient -Discussed importance of 150 minutes of physical activity weekly, eat two servings of fish weekly, eat one serving of tree nuts ( cashews, pistachios, pecans, almonds.Marland Kitchen.) every other day, eat 6 servings of fruit/vegetables daily and drink plenty of water and avoid sweet beverages.  -Reviewed Health Maintenance: yes

## 2021-04-13 NOTE — Patient Instructions (Addendum)
It was great seeing you today! ? ?Plan discussed at today's visit: ?-Blood work ordered today, results will be uploaded to MyChart.  ?-Increase Amlodipine to 10 mg daily (take 5 mg twice a day until out, then new prescription at the pharmacy)  ?-Tetanus vaccine today ?-Cologuard ordered  ? ?Follow up in: 1 month  ? ?Take care and let us know if you have any questions or concerns prior to your next visit. ? ?Dr. Caralee Ates ? ?

## 2021-04-14 LAB — CBC WITH DIFFERENTIAL/PLATELET
Absolute Monocytes: 799 cells/uL (ref 200–950)
Basophils Absolute: 36 cells/uL (ref 0–200)
Basophils Relative: 0.3 %
Eosinophils Absolute: 0 cells/uL — ABNORMAL LOW (ref 15–500)
Eosinophils Relative: 0 %
HCT: 45.4 % (ref 38.5–50.0)
Hemoglobin: 15.8 g/dL (ref 13.2–17.1)
Lymphs Abs: 581 cells/uL — ABNORMAL LOW (ref 850–3900)
MCH: 35.2 pg — ABNORMAL HIGH (ref 27.0–33.0)
MCHC: 34.8 g/dL (ref 32.0–36.0)
MCV: 101.1 fL — ABNORMAL HIGH (ref 80.0–100.0)
MPV: 10.1 fL (ref 7.5–12.5)
Monocytes Relative: 6.6 %
Neutro Abs: 10684 cells/uL — ABNORMAL HIGH (ref 1500–7800)
Neutrophils Relative %: 88.3 %
Platelets: 242 10*3/uL (ref 140–400)
RBC: 4.49 10*6/uL (ref 4.20–5.80)
RDW: 12.3 % (ref 11.0–15.0)
Total Lymphocyte: 4.8 %
WBC: 12.1 10*3/uL — ABNORMAL HIGH (ref 3.8–10.8)

## 2021-04-14 LAB — COMPLETE METABOLIC PANEL WITH GFR
AG Ratio: 1.5 (calc) (ref 1.0–2.5)
ALT: 27 U/L (ref 9–46)
AST: 41 U/L — ABNORMAL HIGH (ref 10–35)
Albumin: 4.8 g/dL (ref 3.6–5.1)
Alkaline phosphatase (APISO): 43 U/L (ref 35–144)
BUN: 12 mg/dL (ref 7–25)
CO2: 26 mmol/L (ref 20–32)
Calcium: 10.1 mg/dL (ref 8.6–10.3)
Chloride: 93 mmol/L — ABNORMAL LOW (ref 98–110)
Creat: 1.16 mg/dL (ref 0.70–1.35)
Globulin: 3.2 g/dL (calc) (ref 1.9–3.7)
Glucose, Bld: 111 mg/dL — ABNORMAL HIGH (ref 65–99)
Potassium: 4.9 mmol/L (ref 3.5–5.3)
Sodium: 132 mmol/L — ABNORMAL LOW (ref 135–146)
Total Bilirubin: 1.2 mg/dL (ref 0.2–1.2)
Total Protein: 8 g/dL (ref 6.1–8.1)
eGFR: 72 mL/min/{1.73_m2} (ref >=60)

## 2021-04-14 LAB — LIPID PANEL
Cholesterol: 194 mg/dL (ref <200)
HDL: 114 mg/dL (ref >=40)
LDL Cholesterol (Calc): 66 mg/dL (calc)
Non-HDL Cholesterol (Calc): 80 mg/dL (calc) (ref <130)
Total CHOL/HDL Ratio: 1.7 (calc) (ref <5.0)
Triglycerides: 55 mg/dL (ref <150)

## 2021-04-14 LAB — HEPATITIS C ANTIBODY
Hepatitis C Ab: NONREACTIVE
SIGNAL TO CUT-OFF: 0.19 (ref <1.00)

## 2021-04-14 LAB — HIV ANTIBODY (ROUTINE TESTING W REFLEX): HIV 1&2 Ab, 4th Generation: NONREACTIVE

## 2021-04-14 LAB — PSA: PSA: 0.8 ng/mL (ref <=4.00)

## 2021-04-23 LAB — COLOGUARD

## 2021-04-24 ENCOUNTER — Other Ambulatory Visit: Admission: RE | Admit: 2021-04-24 | Payer: Medicaid Other | Source: Ambulatory Visit

## 2021-04-25 ENCOUNTER — Ambulatory Visit: Payer: Medicaid Other | Attending: Pulmonary Disease

## 2021-04-25 ENCOUNTER — Other Ambulatory Visit: Payer: Self-pay

## 2021-04-25 DIAGNOSIS — R0602 Shortness of breath: Secondary | ICD-10-CM

## 2021-04-25 MED ORDER — ALBUTEROL SULFATE (2.5 MG/3ML) 0.083% IN NEBU
2.5000 mg | INHALATION_SOLUTION | Freq: Once | RESPIRATORY_TRACT | Status: AC
  Administered 2021-04-25: 2.5 mg via RESPIRATORY_TRACT
  Filled 2021-04-25: qty 3

## 2021-04-26 ENCOUNTER — Other Ambulatory Visit: Payer: Self-pay | Admitting: Pulmonary Disease

## 2021-05-01 ENCOUNTER — Ambulatory Visit
Admission: RE | Admit: 2021-05-01 | Discharge: 2021-05-01 | Disposition: A | Discharge status: Home / Self Care | Payer: Medicaid Other | Attending: Pulmonary Disease | Admitting: Pulmonary Disease

## 2021-05-01 ENCOUNTER — Ambulatory Visit
Admission: RE | Admit: 2021-05-01 | Discharge: 2021-05-01 | Disposition: A | Discharge status: Home / Self Care | Payer: Medicaid Other | Source: Ambulatory Visit | Attending: Pulmonary Disease | Admitting: Pulmonary Disease

## 2021-05-01 ENCOUNTER — Ambulatory Visit: Payer: Medicaid Other | Admitting: Pulmonary Disease

## 2021-05-01 DIAGNOSIS — R0602 Shortness of breath: Secondary | ICD-10-CM | POA: Insufficient documentation

## 2021-05-04 ENCOUNTER — Ambulatory Visit: Payer: Medicaid Other | Admitting: Pulmonary Disease

## 2021-05-11 ENCOUNTER — Encounter: Payer: Self-pay | Admitting: Internal Medicine

## 2021-05-11 ENCOUNTER — Ambulatory Visit: Payer: Self-pay | Admitting: Internal Medicine

## 2021-05-11 VITALS — BP 180/84 | HR 110 | Temp 99.1°F | Resp 16 | Ht 71.0 in | Wt 146.9 lb

## 2021-05-11 DIAGNOSIS — Z789 Other specified health status: Secondary | ICD-10-CM

## 2021-05-11 DIAGNOSIS — I10 Essential (primary) hypertension: Secondary | ICD-10-CM

## 2021-05-11 DIAGNOSIS — J45909 Unspecified asthma, uncomplicated: Secondary | ICD-10-CM | POA: Insufficient documentation

## 2021-05-11 DIAGNOSIS — Z1211 Encounter for screening for malignant neoplasm of colon: Secondary | ICD-10-CM

## 2021-05-11 DIAGNOSIS — R718 Other abnormality of red blood cells: Secondary | ICD-10-CM

## 2021-05-11 DIAGNOSIS — R7401 Elevation of levels of liver transaminase levels: Secondary | ICD-10-CM

## 2021-05-11 MED ORDER — HYDROCHLOROTHIAZIDE 12.5 MG PO CAPS: 12.5000 mg | ORAL_CAPSULE | Freq: Every day | ORAL | 1 refills | Status: DC

## 2021-05-11 NOTE — Patient Instructions (Addendum)
It was great seeing you today! ? ?Plan discussed at today's visit: ?-Blood work ordered today, results will be uploaded to MyChart.  ?-For blood pressure: continue Amlodipine 10 mg and new medication HCTZ 12.5 mg added as well ?-Check blood pressure every other day around lunch time and write them down to bring to follow up  ?-Based off of previous blood work, I would strongly recommend you stop drinking alcohol. If your liver tests are still elevated, we will get a picture of your liver to evaluate further  ? ?Follow up in: 1 month   ? ?Take care and let us know if you have any questions or concerns prior to your next visit. ? ?Dr. Caralee Ates ? ?

## 2021-05-11 NOTE — Assessment & Plan Note (Signed)
Stable on Tregely, sample given today. Continue Albuterol PRN as well. Following with Pulmonology.  ?

## 2021-05-11 NOTE — Assessment & Plan Note (Addendum)
Blood pressure continues to be elevated, inconsistent checking at home but has been high when he does check it. Continue Amlodipine 10 mg, add HCTZ 12.5 mg daily. Allergy to ACEI. Recheck in 1 month. ?

## 2021-05-11 NOTE — Progress Notes (Signed)
? ?Acute Office Visit ? ?Subjective:  ? ? Patient ID: Dakota Blackwell, male    DOB: 1960/08/23, 61 y.o.   MRN: [id number] ? ?Chief Complaint  ?Patient presents with  ? Follow-up  ? Hypertension  ? ? ?HPI ?Patient is in today for follow up on blood pressure. ? ?Hypertension: ?-Medications: Amlodipine 10 (history of ACEI but had facial swelling) ?-Checking BP at home (average): 160/90 ?-Denies any SOB, CP, vision changes, LE edema or symptoms of hypotension ?-Diet: working on cutting down in sodium but does drink 4-6 beers a day most days ?-Exercise: no routine but likes to walk ? ?Asthma: ?-Following with Pulmonology  ?-Asthma status: stable ?-Current Treatments: Trelegy, Albuterol PRN ?-Satisfied with current treatment?: yes ?-Influenza: Up to Date ? ?Elevated LFT: ?-AST elevated to 41, ALT normal, Na 132 on CMP 3/23 ?-MCV elevated as well on CBC to 101.1 ?-Endorses drinking multiple beers a day most days, denies withdrawal symptoms  ? ?Health Maintenance: ?-Blood work UTD ?-Colon cancer screening due ? ? ?Family History  ?Problem Relation Age of Onset  ? COPD Father   ? Cancer Father   ?     bone  ? ? ?Social History  ? ?Socioeconomic History  ? Marital status: Single  ?  Spouse name: Not on file  ? Number of children: Not on file  ? Years of education: Not on file  ? Highest education level: Not on file  ?Occupational History  ? Not on file  ?Tobacco Use  ? Smoking status: Former  ?  Packs/day: 0.50  ?  Years: 15.00  ?  Pack years: 7.50  ?  Types: Cigarettes  ?  Quit date: 2013  ?  Years since quitting: 10.2  ? Smokeless tobacco: Never  ?Vaping Use  ? Vaping Use: Never used  ?Substance and Sexual Activity  ? Alcohol use: Never  ? Drug use: Never  ? Sexual activity: Not on file  ?Other Topics Concern  ? Not on file  ?Social History Narrative  ? Not on file  ? ?Social Determinants of Health  ? ?Financial Resource Strain: Low Risk   ? Difficulty of Paying Living Expenses: Not hard at all  ?Food Insecurity: No Food  Insecurity  ? Worried About Charity fundraiser in the Last Year: Never true  ? Ran Out of Food in the Last Year: Never true  ?Transportation Needs: No Transportation Needs  ? Lack of Transportation (Medical): No  ? Lack of Transportation (Non-Medical): No  ?Physical Activity: Inactive  ? Days of Exercise per Week: 0 days  ? Minutes of Exercise per Session: 0 min  ?Stress: No Stress Concern Present  ? Feeling of Stress : Not at all  ?Social Connections: Socially Isolated  ? Frequency of Communication with Friends and Family: Once a week  ? Frequency of Social Gatherings with Friends and Family: Once a week  ? Attends Religious Services: Never  ? Active Member of Clubs or Organizations: No  ? Attends Archivist Meetings: Never  ? Marital Status: Never married  ?Intimate Partner Violence: Not At Risk  ? Fear of Current or Ex-Partner: No  ? Emotionally Abused: No  ? Physically Abused: No  ? Sexually Abused: No  ? ? ?Outpatient Medications Prior to Visit  ?Medication Sig Dispense Refill  ? albuterol (VENTOLIN HFA) 108 (90 Base) MCG/ACT inhaler Inhale 2 puffs into the lungs every 6 (six) hours as needed for wheezing or shortness of breath. 8 g 6  ?  amLODipine (NORVASC) 10 MG tablet Take 1 tablet (10 mg total) by mouth daily. 90 tablet 0  ? fluticasone (FLONASE) 50 MCG/ACT nasal spray Place 2 sprays into both nostrils daily. 16 g 6  ? Fluticasone-Umeclidin-Vilant (TRELEGY ELLIPTA) 200-62.5-25 MCG/ACT AEPB Inhale 1 puff into the lungs daily. 14 each 0  ? predniSONE (STERAPRED UNI-PAK 21 TAB) 10 MG (21) TBPK tablet Take as directed in the package. 21 tablet 0  ? ?No facility-administered medications prior to visit.  ? ? ?Allergies  ?Allergen Reactions  ? Ace Inhibitors Swelling  ? ? ?Review of Systems  ?Constitutional:  Negative for chills and fever.  ?Eyes:  Negative for visual disturbance.  ?Respiratory:  Negative for cough, shortness of breath and wheezing.   ?Cardiovascular:  Negative for chest pain,  palpitations and leg swelling.  ?Gastrointestinal:  Negative for abdominal pain.  ?Neurological:  Negative for dizziness and headaches.  ? ?   ?Objective:  ?  ?Physical Exam ?Constitutional:   ?   Appearance: Normal appearance.  ?HENT:  ?   Head: Normocephalic and atraumatic.  ?Eyes:  ?   Conjunctiva/sclera: Conjunctivae normal.  ?Cardiovascular:  ?   Rate and Rhythm: Normal rate and regular rhythm.  ?Pulmonary:  ?   Effort: Pulmonary effort is normal.  ?   Breath sounds: Normal breath sounds.  ?Musculoskeletal:  ?   Right lower leg: No edema.  ?   Left lower leg: No edema.  ?Skin: ?   General: Skin is warm and dry.  ?Neurological:  ?   General: No focal deficit present.  ?   Mental Status: He is alert. Mental status is at baseline.  ?Psychiatric:     ?   Mood and Affect: Mood normal.     ?   Behavior: Behavior normal.  ? ? ?BP (!) 180/84   Pulse (!) 110   Temp 99.1 ?F (37.3 ?C)   Resp 16   Ht $R'5\' 11"'Fj$  (1.803 m)   Wt 146 lb 14.4 oz (66.6 kg)   SpO2 96%   BMI 20.49 kg/m?  ?Wt Readings from Last 3 Encounters:  ?04/13/21 140 lb 4.8 oz (63.6 kg)  ?04/10/21 139 lb (63 kg)  ?03/16/21 139 lb (63 kg)  ? ?Vitals:  ? 05/11/21 1453 05/11/21 1513  ?BP: (!) 182/82 (!) 180/84  ? ? ? ?There are no preventive care reminders to display for this patient. ? ? ?There are no preventive care reminders to display for this patient. ? ? ?No results found for: TSH ?Lab Results  ?Component Value Date  ? WBC 12.1 (H) 04/13/2021  ? HGB 15.8 04/13/2021  ? HCT 45.4 04/13/2021  ? MCV 101.1 (H) 04/13/2021  ? PLT 242 04/13/2021  ? ?Lab Results  ?Component Value Date  ? NA 132 (L) 04/13/2021  ? K 4.9 04/13/2021  ? CO2 26 04/13/2021  ? GLUCOSE 111 (H) 04/13/2021  ? BUN 12 04/13/2021  ? CREATININE 1.16 04/13/2021  ? BILITOT 1.2 04/13/2021  ? AST 41 (H) 04/13/2021  ? ALT 27 04/13/2021  ? PROT 8.0 04/13/2021  ? CALCIUM 10.1 04/13/2021  ? EGFR 72 04/13/2021  ? ?Lab Results  ?Component Value Date  ? CHOL 194 04/13/2021  ? ?Lab Results  ?Component  Value Date  ? HDL 114 04/13/2021  ? ?Lab Results  ?Component Value Date  ? [place] 66 04/13/2021  ? ?Lab Results  ?Component Value Date  ? TRIG 55 04/13/2021  ? ?Lab Results  ?Component Value Date  ? CHOLHDL 1.7  04/13/2021  ? ?No results found for: HGBA1C ? ?   ?Assessment & Plan:  ? ?Problem List Items Addressed This Visit   ? ?  ? Cardiovascular and Mediastinum  ? Hypertension - Primary  ?  Blood pressure continues to be elevated, inconsistent checking at home but has been high when he does check it. Continue Amlodipine 10 mg, add HCTZ 12.5 mg daily. Allergy to ACEI. Recheck in 1 month. ?  ?  ? Relevant Medications  ? hydrochlorothiazide (MICROZIDE) 12.5 MG capsule  ? Other Relevant Orders  ? COMPLETE METABOLIC PANEL WITH GFR  ?  ? Respiratory  ? Moderate asthma  ?  Stable on Tregely, sample given today. Continue Albuterol PRN as well. Following with Pulmonology.  ?  ?  ? ?Other Visit Diagnoses   ? ? Alcohol use    - nearly daily alcohol use, counseled about risks of this and recommend tapering now and eventually stopping altogether. Discussed referral to social work for continued support, which the patient declines today. Plan to recheck CMP, folate, thiamine, b12 levels. Plan to RUQ [place] if LFTs continue to be elevated.   ? Relevant Orders  ? CBC w/Diff/Platelet  ? COMPLETE METABOLIC PANEL WITH GFR  ? B12 and Folate Panel  ? Vitamin B1  ? Elevated MCV      ? Relevant Orders  ? COMPLETE METABOLIC PANEL WITH GFR  ? B12 and Folate Panel  ? Colon cancer screening      ? Relevant Orders  ? Cologuard  ? Elevated AST (SGOT)      ? Relevant Orders  ? COMPLETE METABOLIC PANEL WITH GFR  ? Vitamin B1  ? ?  ? ? ?Teodora Medici, DO ? ?

## 2021-05-18 LAB — COMPLETE METABOLIC PANEL WITH GFR
AG Ratio: 1.5 (calc) (ref 1.0–2.5)
ALT: 14 U/L (ref 9–46)
AST: 23 U/L (ref 10–35)
Albumin: 4.4 g/dL (ref 3.6–5.1)
Alkaline phosphatase (APISO): 36 U/L (ref 35–144)
BUN: 11 mg/dL (ref 7–25)
CO2: 29 mmol/L (ref 20–32)
Calcium: 9.6 mg/dL (ref 8.6–10.3)
Chloride: 94 mmol/L — ABNORMAL LOW (ref 98–110)
Creat: 0.85 mg/dL (ref 0.70–1.35)
Globulin: 2.9 g/dL (calc) (ref 1.9–3.7)
Glucose, Bld: 94 mg/dL (ref 65–99)
Potassium: 4 mmol/L (ref 3.5–5.3)
Sodium: 133 mmol/L — ABNORMAL LOW (ref 135–146)
Total Bilirubin: 1.3 mg/dL — ABNORMAL HIGH (ref 0.2–1.2)
Total Protein: 7.3 g/dL (ref 6.1–8.1)
eGFR: 99 mL/min/{1.73_m2} (ref >=60)

## 2021-05-18 LAB — CBC WITH DIFFERENTIAL/PLATELET
Absolute Monocytes: 1334 cells/uL — ABNORMAL HIGH (ref 200–950)
Basophils Absolute: 59 cells/uL (ref 0–200)
Basophils Relative: 0.5 %
Eosinophils Absolute: 164 cells/uL (ref 15–500)
Eosinophils Relative: 1.4 %
HCT: 41.1 % (ref 38.5–50.0)
Hemoglobin: 14.2 g/dL (ref 13.2–17.1)
Lymphs Abs: 2340 cells/uL (ref 850–3900)
MCH: 34.8 pg — ABNORMAL HIGH (ref 27.0–33.0)
MCHC: 34.5 g/dL (ref 32.0–36.0)
MCV: 100.7 fL — ABNORMAL HIGH (ref 80.0–100.0)
MPV: 10.2 fL (ref 7.5–12.5)
Monocytes Relative: 11.4 %
Neutro Abs: 7804 cells/uL — ABNORMAL HIGH (ref 1500–7800)
Neutrophils Relative %: 66.7 %
Platelets: 192 10*3/uL (ref 140–400)
RBC: 4.08 10*6/uL — ABNORMAL LOW (ref 4.20–5.80)
RDW: 12 % (ref 11.0–15.0)
Total Lymphocyte: 20 %
WBC: 11.7 10*3/uL — ABNORMAL HIGH (ref 3.8–10.8)

## 2021-05-18 LAB — VITAMIN B1: Vitamin B1 (Thiamine): 11 nmol/L (ref 8–30)

## 2021-05-18 LAB — B12 AND FOLATE PANEL
Folate: 17.9 ng/mL
Vitamin B-12: 315 pg/mL (ref 200–1100)

## 2021-06-06 ENCOUNTER — Telehealth: Payer: Self-pay

## 2021-06-06 LAB — COLOGUARD

## 2021-06-06 NOTE — Telephone Encounter (Signed)
No samples but rhonda is calling to get some ?

## 2021-06-06 NOTE — Telephone Encounter (Signed)
Copied from CRM [id number]. Topic: General - Other >> Jun 06, 2021 10:07 AM Darron Doom wrote: Reason for CRM: Patient wife called in asking Dr Caralee Ates if she have any more samples of Fluticasone-Umeclidin-Vilant (TRELEGY ELLIPTA) 200-62.5-25 MCG/ACT AEPB or if no sample please send an Rx to patients pharmacy. Can be reached at Ph# [phone] [id number] - [place], [place] - [address] AT [facility] OF [address] [facility]  Phone:  [phone] Fax:  [phone]

## 2021-06-13 ENCOUNTER — Encounter: Payer: Self-pay | Admitting: Internal Medicine

## 2021-06-13 ENCOUNTER — Ambulatory Visit (INDEPENDENT_AMBULATORY_CARE_PROVIDER_SITE_OTHER): Payer: Self-pay | Admitting: Internal Medicine

## 2021-06-13 VITALS — BP 152/88 | HR 102 | Temp 98.3°F | Resp 16 | Ht 71.0 in | Wt 155.9 lb

## 2021-06-13 DIAGNOSIS — J45909 Unspecified asthma, uncomplicated: Secondary | ICD-10-CM

## 2021-06-13 DIAGNOSIS — I10 Essential (primary) hypertension: Secondary | ICD-10-CM

## 2021-06-13 MED ORDER — CHLORTHALIDONE 25 MG PO TABS: 25.0000 mg | ORAL_TABLET | Freq: Every day | ORAL | 1 refills | Status: DC

## 2021-06-13 NOTE — Assessment & Plan Note (Signed)
Stable, asked for Tregely refills but unfortunately we don't have any today, recommend calling Pulmonologist office. No respiratory symptoms, no shortness of breath or wheezing today.  ?

## 2021-06-13 NOTE — Progress Notes (Signed)
? ?Acute Office Visit ? ?Subjective:  ? ? Patient ID: Dakota Blackwell, male    DOB: November 05, 1960, 61 y.o.   MRN: [id number] ? ?Chief Complaint  ?Patient presents with  ? Follow-up  ? Hypertension  ? Asthma  ? ? ?HPI ?Patient is in today for follow up on blood pressure. ? ?Hypertension: ?-Medications: Amlodipine 10 (history of ACEI but had facial swelling).  At his last visit HCTZ 12.5 mg daily was added to his regimen, which she has been consistent with until about 3 days ago when he ran out of medication.  Today his blood pressure is much better controlled.  He has noticed a very pruritic rash that started over his eyebrows when he started this medication.  It is not painful and is not spreading but is itchy. ?-Checking BP at home (average): 143-162/80-90 ?-Denies any SOB, CP, vision changes, LE edema or symptoms of hypotension ?-Diet: working on cutting down in sodium and has completely cut out alcohol  ?-Exercise: no routine but likes to walk ? ?Asthma: ?-Following with Pulmonology  ?-Asthma status: stable ?-Current Treatments: Trelegy, Albuterol PRN ?-Satisfied with current treatment?: yes ?-Influenza: Up to Date ? ?Elevated LFT: ?-AST elevated to 41, ALT normal, Na 132 on CMP 3/23.  On recheck labs from 05/11/2021 liver enzymes returned back to normal, AST 23, ALT 14.  Total bilirubin slightly elevated at 1.3 and sodium slightly low at 133 ?-MCV elevated as well on CBC to 101.1, recheck at 100.7.  Vitamin B12, folate and thiamine all within normal limits. ?-Had been drinking multiple beers a day most days but has stopped drinking alcohol completely since our last visit  ? ?Health Maintenance: ?-Blood work UTD ?-Colon cancer screening due ? ? ?Family History  ?Problem Relation Age of Onset  ? COPD Father   ? Cancer Father   ?     bone  ? ? ?Social History  ? ?Socioeconomic History  ? Marital status: Single  ?  Spouse name: Not on file  ? Number of children: Not on file  ? Years of education: Not on file  ? Highest  education level: Not on file  ?Occupational History  ? Not on file  ?Tobacco Use  ? Smoking status: Former  ?  Packs/day: 0.50  ?  Years: 15.00  ?  Pack years: 7.50  ?  Types: Cigarettes  ?  Quit date: 2013  ?  Years since quitting: 10.3  ? Smokeless tobacco: Never  ?Vaping Use  ? Vaping Use: Never used  ?Substance and Sexual Activity  ? Alcohol use: Never  ? Drug use: Never  ? Sexual activity: Not on file  ?Other Topics Concern  ? Not on file  ?Social History Narrative  ? Not on file  ? ?Social Determinants of Health  ? ?Financial Resource Strain: Low Risk   ? Difficulty of Paying Living Expenses: Not hard at all  ?Food Insecurity: No Food Insecurity  ? Worried About Charity fundraiser in the Last Year: Never true  ? Ran Out of Food in the Last Year: Never true  ?Transportation Needs: No Transportation Needs  ? Lack of Transportation (Medical): No  ? Lack of Transportation (Non-Medical): No  ?Physical Activity: Inactive  ? Days of Exercise per Week: 0 days  ? Minutes of Exercise per Session: 0 min  ?Stress: No Stress Concern Present  ? Feeling of Stress : Not at all  ?Social Connections: Socially Isolated  ? Frequency of Communication with Friends and Family:  Once a week  ? Frequency of Social Gatherings with Friends and Family: Once a week  ? Attends Religious Services: Never  ? Active Member of Clubs or Organizations: No  ? Attends Archivist Meetings: Never  ? Marital Status: Never married  ?Intimate Partner Violence: Not At Risk  ? Fear of Current or Ex-Partner: No  ? Emotionally Abused: No  ? Physically Abused: No  ? Sexually Abused: No  ? ? ?Outpatient Medications Prior to Visit  ?Medication Sig Dispense Refill  ? albuterol (VENTOLIN HFA) 108 (90 Base) MCG/ACT inhaler Inhale 2 puffs into the lungs every 6 (six) hours as needed for wheezing or shortness of breath. 8 g 6  ? amLODipine (NORVASC) 10 MG tablet Take 1 tablet (10 mg total) by mouth daily. 90 tablet 0  ? fluticasone (FLONASE) 50 MCG/ACT  nasal spray Place 2 sprays into both nostrils daily. 16 g 6  ? Fluticasone-Umeclidin-Vilant (TRELEGY ELLIPTA) 200-62.5-25 MCG/ACT AEPB Inhale 1 puff into the lungs daily. 14 each 0  ? hydrochlorothiazide (MICROZIDE) 12.5 MG capsule Take 1 capsule (12.5 mg total) by mouth daily. 30 capsule 1  ? predniSONE (STERAPRED UNI-PAK 21 TAB) 10 MG (21) TBPK tablet Take as directed in the package. 21 tablet 0  ? ?No facility-administered medications prior to visit.  ? ? ?Allergies  ?Allergen Reactions  ? Ace Inhibitors Swelling  ? ? ?Review of Systems  ?Constitutional:  Negative for chills and fever.  ?Eyes:  Negative for visual disturbance.  ?Respiratory:  Negative for cough, shortness of breath and wheezing.   ?Cardiovascular:  Negative for chest pain, palpitations and leg swelling.  ?Gastrointestinal:  Negative for abdominal pain.  ?Skin:  Positive for rash.  ?Neurological:  Negative for dizziness and headaches.  ? ?   ?Objective:  ?  ?Physical Exam ?Constitutional:   ?   Appearance: Normal appearance.  ?HENT:  ?   Head: Normocephalic and atraumatic.  ?Eyes:  ?   Conjunctiva/sclera: Conjunctivae normal.  ?Cardiovascular:  ?   Rate and Rhythm: Normal rate and regular rhythm.  ?Pulmonary:  ?   Effort: Pulmonary effort is normal.  ?   Breath sounds: Normal breath sounds.  ?Musculoskeletal:  ?   Right lower leg: No edema.  ?   Left lower leg: No edema.  ?Skin: ?   General: Skin is warm and dry.  ?   Comments: Small bumps over eyebrows, no erythema, no current infection, no blisters or drainage.   ?Neurological:  ?   General: No focal deficit present.  ?   Mental Status: He is alert. Mental status is at baseline.  ?Psychiatric:     ?   Mood and Affect: Mood normal.     ?   Behavior: Behavior normal.  ? ? ?BP (!) 150/84   Pulse (!) 102   Temp 98.3 ?F (36.8 ?C)   Resp 16   Ht 5' 11"  (1.803 m)   Wt 155 lb 14.4 oz (70.7 kg)   SpO2 95%   BMI 21.74 kg/m?  ?Wt Readings from Last 3 Encounters:  ?06/13/21 155 lb 14.4 oz (70.7 kg)   ?05/11/21 146 lb 14.4 oz (66.6 kg)  ?04/13/21 140 lb 4.8 oz (63.6 kg)  ? ?Vitals:  ? 06/13/21 1448 06/13/21 1512  ?BP: (!) 150/84 (!) 152/88  ? ? ? ?Health Maintenance Due  ?Topic Date Due  ? Zoster Vaccines- Shingrix (1 of 2) Never done  ? ? ? ?There are no preventive care reminders to display for  this patient. ? ? ?No results found for: TSH ?Lab Results  ?Component Value Date  ? WBC 11.7 (H) 05/11/2021  ? HGB 14.2 05/11/2021  ? HCT 41.1 05/11/2021  ? MCV 100.7 (H) 05/11/2021  ? PLT 192 05/11/2021  ? ?Lab Results  ?Component Value Date  ? NA 133 (L) 05/11/2021  ? K 4.0 05/11/2021  ? CO2 29 05/11/2021  ? GLUCOSE 94 05/11/2021  ? BUN 11 05/11/2021  ? CREATININE 0.85 05/11/2021  ? BILITOT 1.3 (H) 05/11/2021  ? AST 23 05/11/2021  ? ALT 14 05/11/2021  ? PROT 7.3 05/11/2021  ? CALCIUM 9.6 05/11/2021  ? EGFR 99 05/11/2021  ? ?Lab Results  ?Component Value Date  ? CHOL 194 04/13/2021  ? ?Lab Results  ?Component Value Date  ? HDL 114 04/13/2021  ? ?Lab Results  ?Component Value Date  ? [place] 66 04/13/2021  ? ?Lab Results  ?Component Value Date  ? TRIG 55 04/13/2021  ? ?Lab Results  ?Component Value Date  ? CHOLHDL 1.7 04/13/2021  ? ?No results found for: HGBA1C ? ?   ?Assessment & Plan:  ? ?Problem List Items Addressed This Visit   ? ?  ? Cardiovascular and Mediastinum  ? Hypertension - Primary  ?  Blood pressure still uncontrolled but coming down. Had a reaction in the form of a rash over eyebrows with the HCTZ, will discontinue this and try Chlorthalidone 25 mg. If rash worsens he will stop the medication and call right away. Continue Amlodipine 10 mg daily and continue checking blood pressure at home. Cannot take ACEI due to angioedema in the past. Completely stopped drinking alcohol which is excellent and really working on diet, discussed Mediterranean diet today and he was given literature on this.  ? ?  ?  ? Relevant Medications  ? chlorthalidone (HYGROTON) 25 MG tablet  ?  ? Respiratory  ? Moderate asthma  ?   Stable, asked for Tregely refills but unfortunately we don't have any today, recommend calling Pulmonologist office. No respiratory symptoms, no shortness of breath or wheezing today.  ? ?  ?  ? ? ? ?Threasa Alpha

## 2021-06-13 NOTE — Patient Instructions (Addendum)
It was great seeing you today! ? ?Plan discussed at today's visit: ?-Continue Amlodipine 10 mg daily, stop Hydrochlorothiazide and start new blood pressure medication, Chlorthalidone 25 mg daily. If the rash gets worse or if you have any other side effects, stop the medication right away and call me  ?-Continue to check blood pressure at home  ?-I will call you if I get Trelegy samples but call Pulmonologist and see if they have any  ? ?Follow up in: 1 month  ? ?Take care and let us know if you have any questions or concerns prior to your next visit. ? ?Dr. Caralee Ates ? ?Mediterranean Diet ?A Mediterranean diet refers to food and lifestyle choices that are based on the traditions of countries located on the [place]. It focuses on eating more fruits, vegetables, whole grains, beans, nuts, seeds, and heart-healthy fats, and eating less dairy, meat, eggs, and processed foods with added sugar, salt, and fat. This way of eating has been shown to help prevent certain conditions and improve outcomes for people who have chronic diseases, like kidney disease and heart disease. ?What are tips for following this plan? ?Reading food labels ?Check the serving size of packaged foods. For foods such as rice and pasta, the serving size refers to the amount of cooked product, not dry. ?Check the total fat in packaged foods. Avoid foods that have saturated fat or trans fats. ?Check the ingredient list for added sugars, such as corn syrup. ?Shopping ? ?Buy a variety of foods that offer a balanced diet, including: ?Fresh fruits and vegetables (produce). ?Grains, beans, nuts, and seeds. Some of these may be available in unpackaged forms or large amounts (in bulk). ?Fresh seafood. ?Poultry and eggs. ?Low-fat dairy products. ?Buy whole ingredients instead of prepackaged foods. ?Buy fresh fruits and vegetables in-season from local farmers markets. ?Buy plain frozen fruits and vegetables. ?If you do not have access to quality fresh  seafood, buy precooked frozen shrimp or canned fish, such as tuna, salmon, or sardines. ?Stock your pantry so you always have certain foods on hand, such as olive oil, canned tuna, canned tomatoes, rice, pasta, and beans. ?Cooking ?Cook foods with extra-virgin olive oil instead of using butter or other vegetable oils. ?Have meat as a side dish, and have vegetables or grains as your main dish. This means having meat in small portions or adding small amounts of meat to foods like pasta or stew. ?Use beans or vegetables instead of meat in common dishes like chili or lasagna. ?Experiment with different cooking methods. Try roasting, broiling, steaming, and saut?ing vegetables. ?Add frozen vegetables to soups, stews, pasta, or rice. ?Add nuts or seeds for added healthy fats and plant protein at each meal. You can add these to yogurt, salads, or vegetable dishes. ?Marinate fish or vegetables using olive oil, lemon juice, garlic, and fresh herbs. ?Meal planning ?Plan to eat one vegetarian meal one day each week. Try to work up to two vegetarian meals, if possible. ?Eat seafood two or more times a week. ?Have healthy snacks readily available, such as: ?Vegetable sticks with hummus. ?[place] yogurt. ?Fruit and nut trail mix. ?Eat balanced meals throughout the week. This includes: ?Fruit: 2-3 servings a day. ?Vegetables: 4-5 servings a day. ?Low-fat dairy: 2 servings a day. ?Fish, poultry, or lean meat: 1 serving a day. ?Beans and legumes: 2 or more servings a week. ?Nuts and seeds: 1-2 servings a day. ?Whole grains: 6-8 servings a day. ?Extra-virgin olive oil: 3-4 servings a day. ?Limit red  meat and sweets to only a few servings a month. ?Lifestyle ? ?Cook and eat meals together with your family, when possible. ?Drink enough fluid to keep your urine pale yellow. ?Be physically active every day. This includes: ?Aerobic exercise like running or swimming. ?Leisure activities like gardening, walking, or housework. ?Get 7-8 hours  of sleep each night. ?If recommended by your health care provider, drink red wine in moderation. This means 1 glass a day for nonpregnant women and 2 glasses a day for men. A glass of wine equals 5 oz (150 mL). ?What foods should I eat? ?Fruits ?Apples. Apricots. Avocado. Berries. Bananas. Cherries. Dates. Figs. Grapes. Lemons. Melon. Oranges. Peaches. Plums. Pomegranate. ?Vegetables ?Artichokes. Beets. Broccoli. Cabbage. Carrots. Eggplant. Green beans. Chard. Kale. Spinach. Onions. Leeks. Peas. Squash. Tomatoes. Peppers. Radishes. ?Grains ?Whole-grain pasta. Brown rice. Bulgur wheat. Polenta. Couscous. Whole-wheat bread. Orpah Cobb. ?Meats and other proteins ?Beans. Almonds. Sunflower seeds. Pine nuts. Peanuts. Cod. Salmon. Scallops. Shrimp. Tuna. Tilapia. Clams. Oysters. Eggs. Poultry without skin. ?Dairy ?Low-fat milk. Cheese. Greek yogurt. ?Fats and oils ?Extra-virgin olive oil. Avocado oil. Grapeseed oil. ?Beverages ?Water. Red wine. Herbal tea. ?Sweets and desserts ?Greek yogurt with honey. Baked apples. Poached pears. Trail mix. ?Seasonings and condiments ?Basil. Cilantro. Coriander. Cumin. Mint. Parsley. Sage. Rosemary. Tarragon. Garlic. Oregano. Thyme. Pepper. Balsamic vinegar. Tahini. Hummus. Tomato sauce. Olives. Mushrooms. ?The items listed above may not be a complete list of foods and beverages you can eat. Contact a dietitian for more information. ?What foods should I limit? ?This is a list of foods that should be eaten rarely or only on special occasions. ?Fruits ?Fruit canned in syrup. ?Vegetables ?Deep-fried potatoes (french fries). ?Grains ?Prepackaged pasta or rice dishes. Prepackaged cereal with added sugar. Prepackaged snacks with added sugar. ?Meats and other proteins ?Beef. Pork. Lamb. Poultry with skin. Hot dogs. Tomasa Blase. ?Dairy ?Ice cream. Sour cream. Whole milk. ?Fats and oils ?Butter. Canola oil. Vegetable oil. Beef fat (tallow). Lard. ?Beverages ?Juice. Sugar-sweetened soft drinks.  Beer. Liquor and spirits. ?Sweets and desserts ?Cookies. Cakes. Pies. Candy. ?Seasonings and condiments ?Mayonnaise. Pre-made sauces and marinades. ?The items listed above may not be a complete list of foods and beverages you should limit. Contact a dietitian for more information. ?Summary ?The Mediterranean diet includes both food and lifestyle choices. ?Eat a variety of fresh fruits and vegetables, beans, nuts, seeds, and whole grains. ?Limit the amount of red meat and sweets that you eat. ?If recommended by your health care provider, drink red wine in moderation. This means 1 glass a day for nonpregnant women and 2 glasses a day for men. A glass of wine equals 5 oz (150 mL). ?This information is not intended to replace advice given to you by your health care provider. Make sure you discuss any questions you have with your health care provider. ?Document Revised: 02/27/2019 Document Reviewed: 12/25/2018 ?Elsevier Patient Education ? 2023 Elsevier Inc. ?

## 2021-06-13 NOTE — Assessment & Plan Note (Addendum)
Blood pressure still uncontrolled but coming down. Had a reaction in the form of a rash over eyebrows with the HCTZ, will discontinue this and try Chlorthalidone 25 mg. If rash worsens he will stop the medication and call right away. Continue Amlodipine 10 mg daily and continue checking blood pressure at home. Cannot take ACEI due to angioedema in the past. Completely stopped drinking alcohol which is excellent and really working on diet, discussed Mediterranean diet today and he was given literature on this.  ?

## 2021-06-14 ENCOUNTER — Telehealth: Payer: Self-pay | Admitting: Pulmonary Disease

## 2021-06-14 ENCOUNTER — Other Ambulatory Visit (HOSPITAL_COMMUNITY): Payer: Self-pay

## 2021-06-14 MED ORDER — TRELEGY ELLIPTA 200-62.5-25 MCG/ACT IN AEPB: 1.0000 | INHALATION_SPRAY | Freq: Every day | RESPIRATORY_TRACT | 0 refills | Status: DC

## 2021-06-14 MED ORDER — TRELEGY ELLIPTA 200-62.5-25 MCG/ACT IN AEPB: 1.0000 | INHALATION_SPRAY | Freq: Every day | RESPIRATORY_TRACT | 2 refills | Status: DC

## 2021-06-14 NOTE — Telephone Encounter (Signed)
Pt has family planning  Medicaid. They only pay for specific items and/or medications. PA not needed.

## 2021-06-14 NOTE — Telephone Encounter (Signed)
Spoke to patient's mother, Fayona(DPR). ?Loren Racer stated that trelegy is not covered by insurance. ? ?PA team, can you guys start PA? ?

## 2021-06-14 NOTE — Telephone Encounter (Signed)
Spoke to patient's mother, Fayona(DPR) and relayed below message. She voiced her understanding.  ?One sample of trelegy and [employer] patient assistance application placed up front for pickup. ?Nothing further needed at this time.  ? ?

## 2021-06-14 NOTE — Telephone Encounter (Signed)
Spoke to patient's mother, Foyona(DPR). ?She is aware that we are unable to provide patient with enough samples to last until his appt 07/10/2021. I have offered to leave one sample up front for pickup. Adaline Sill would like Rx sent to [facility].  ?Rx sent. ?Nothing further needed.  ? ?

## 2021-06-15 MED ORDER — TRELEGY ELLIPTA 200-62.5-25 MCG/ACT IN AEPB: 1.0000 | INHALATION_SPRAY | Freq: Every day | RESPIRATORY_TRACT | 11 refills | Status: DC

## 2021-06-15 NOTE — Telephone Encounter (Signed)
Application has been completed by patient. Application and Rx has been placed in Dr. Georgann Housekeeper look at folder for signature.  ?Will fax to [employer] once signed.  ?

## 2021-06-15 NOTE — Addendum Note (Signed)
Addended by: Lajoyce Lauber A on: 06/15/2021 09:51 AM ? ? Modules accepted: Orders ? ?

## 2021-06-19 NOTE — Telephone Encounter (Signed)
Application and Rx faxed to [place]. Received successful fax confirmation.  ?Nothing further needed.  ? ?

## 2021-07-10 ENCOUNTER — Encounter: Payer: Self-pay | Admitting: Pulmonary Disease

## 2021-07-10 ENCOUNTER — Other Ambulatory Visit
Admission: RE | Admit: 2021-07-10 | Discharge: 2021-07-10 | Disposition: A | Discharge status: Home / Self Care | Payer: Medicaid Other | Source: Ambulatory Visit | Attending: Pulmonary Disease | Admitting: Pulmonary Disease

## 2021-07-10 ENCOUNTER — Ambulatory Visit (INDEPENDENT_AMBULATORY_CARE_PROVIDER_SITE_OTHER): Payer: Self-pay | Admitting: Pulmonary Disease

## 2021-07-10 VITALS — BP 150/78 | HR 87 | Temp 97.7°F | Ht 71.0 in | Wt 152.8 lb

## 2021-07-10 DIAGNOSIS — J449 Chronic obstructive pulmonary disease, unspecified: Secondary | ICD-10-CM

## 2021-07-10 DIAGNOSIS — R0602 Shortness of breath: Secondary | ICD-10-CM

## 2021-07-10 MED ORDER — TRELEGY ELLIPTA 200-62.5-25 MCG/ACT IN AEPB: 1.0000 | INHALATION_SPRAY | Freq: Every day | RESPIRATORY_TRACT | 0 refills | Status: DC

## 2021-07-10 MED ORDER — ALBUTEROL SULFATE HFA 108 (90 BASE) MCG/ACT IN AERS: 2.0000 | INHALATION_SPRAY | Freq: Four times a day (QID) | RESPIRATORY_TRACT | 6 refills | Status: DC | PRN

## 2021-07-10 NOTE — Patient Instructions (Signed)
You have severe COPD.  For disability determination you need to contact your Social Security office at the disability determination section.  We are giving you samples of the Trelegy.  We are giving you also a form to fill out to see if you qualify for assistance.  We sent a prescription to your pharmacy for the rescue inhaler.  We will see you in follow-up in 3 months time call sooner should any new problems arise.

## 2021-07-10 NOTE — Progress Notes (Signed)
Subjective:    Patient ID: JAMA KRICHBAUM, male    DOB: 1961-01-05, 61 y.o.   MRN: [id number] Patient Care Team: Margarita Mail, DO as PCP - General (Internal Medicine)  Chief Complaint  Patient presents with   Follow-up    PFT-- occ SOB with heat and exertion and occ wheezing.     HPI Patient is a 61 year old remote former smoker who presents for follow-up on the issue of shortness of breath and COPD with asthma overlap syndrome.  He was initially seen on 10 April 2021.  For the details of that visit please refer to that note.  At that time the patient was noted to have an exacerbation of his asthmatic component.  He was treated with a prednisone taper and started on Trelegy Ellipta.  He was also provided with albuterol for rescue.  The patient was then supposed to follow-up on 27 March however he presented for chest x-ray PA and lateral that was requested but did not show up for his visit.  He presents now for follow-up from his initial evaluation.  Since his initial evaluation his lung function is now been determined to be very limited with an FEV1 in the 21% range.  PFTs are consistent with emphysema as well as his chest x-ray.  The patient quit smoking 10 years ago and his exposure to cigarettes amounts to only 7-1/2 pack years.  This raises the question of possible alpha 1 antitrypsin deficiency.  He presents today stating that Trelegy has been very helpful in controlling his symptoms of dyspnea.  He has not had to use albuterol rescue hardly at all.  He did notice great improvement after prednisone taper.  He inquires about disability.  He has not had any fevers, chills or sweats.  No cough or sputum production.  No hemoptysis.  No chest pain, orthopnea or paroxysmal nocturnal dyspnea.  No lower extremity edema or calf tenderness.  He feels much better main difficulty is inability to afford Trelegy due to cost.  DATA 04/25/2021 PFTs: FEV1 0.71 L or 21% predicted, FVC 2.18 L or 52%  predicted, FEV1/FVC 32%, no bronchodilator response.  Lung volumes mildly reduced likely due to air trapping normalized postbronchodilator.  Diffusion capacity severely decreased.  Consistent with COPD on the basis of emphysema. 05/01/2021 chest x-ray PA and lateral: Increased lung volumes with emphysematous changes.  No other abnormalities.  Review of Systems A 10 point review of systems was performed and it is as noted above otherwise negative.  Patient Active Problem List   Diagnosis Date Noted   Moderate asthma 05/11/2021   Hypertension 04/13/2021   Allergic rhinitis 04/10/2021   History of tobacco use 02/13/2021   Shortness of breath 02/13/2021   Social History   Tobacco Use   Smoking status: Former    Packs/day: 0.50    Years: 15.00    Pack years: 7.50    Types: Cigarettes    Quit date: 2013    Years since quitting: 10.4   Smokeless tobacco: Never  Substance Use Topics   Alcohol use: Never   Current Meds  Medication Sig   Fluticasone-Umeclidin-Vilant (TRELEGY ELLIPTA) 200-62.5-25 MCG/ACT AEPB Inhale 1 puff into the lungs daily.   Immunization History  Administered Date(s) Administered   Influenza,inj,Quad PF,6+ Mos 12/30/2019, 12/26/2020   Janssen (J&J) SARS-COV-2 Vaccination 05/18/2019, 12/30/2019   Pfizer Covid-19 Vaccine Bivalent Booster 37yrs & up 12/26/2020      Objective:   Physical Exam BP (!) 150/78 (BP Location: Left Arm,  Cuff Size: Normal)   Pulse 87   Temp 97.7 F (36.5 C) (Temporal)   Ht 5\' 11"  (1.803 m)   Wt 152 lb 12.8 oz (69.3 kg)   SpO2 96%   BMI 21.31 kg/m  GENERAL: Well-developed, well-nourished gentleman, no acute distress.  No use of accessories today. Fully ambulatory.  No conversational dyspnea. HEAD: Normocephalic, atraumatic.  EYES: Pupils equal, round, reactive to light.  No scleral icterus.  MOUTH: Nose/mouth/throat not examined due to masking requirements for COVID 19. NECK: Supple. No thyromegaly. Trachea midline. No JVD.  No  adenopathy. PULMONARY: Symmetrical breath sounds, poor air movement, no adventitious sounds.   CARDIOVASCULAR: S1 and S2.  Regular rate and rhythm, no rubs murmurs or gallops heard. ABDOMEN: Benign. MUSCULOSKELETAL: No joint deformity, no clubbing, no edema.  NEUROLOGIC: No overt focal deficit, no gait disturbance, speech is fluent. SKIN: Intact,warm,dry. PSYCH: Normal mood, behavior normal  Chest x-ray PA and lateral performed 01 May 2021 showing severe hyperinflation, flattened diaphragms and emphysematous changes:     Assessment & Plan:     ICD-10-CM   1. Stage 4 very severe COPD by GOLD classification (HCC)  J44.9 Alpha-1 antitrypsin phenotype    Fluticasone-Umeclidin-Vilant (TRELEGY ELLIPTA) 200-62.5-25 MCG/ACT AEPB   Continue Trelegy 200/62.5/25, 1 inhalation daily Patient received samples and medication assistance form Refill albuterol for PRN use Alpha-1 antitrypsin    2. Asthma-COPD overlap syndrome (HCC)  J44.9    Trelegy as above    3. Shortness of breath  R06.02    Improved on Trelegy and as needed albuterol     Orders Placed This Encounter  Procedures   Alpha-1 antitrypsin phenotype    Standing Status:   Future    Standing Expiration Date:   07/11/2022   Meds ordered this encounter  Medications   Fluticasone-Umeclidin-Vilant (TRELEGY ELLIPTA) 200-62.5-25 MCG/ACT AEPB    Sig: Inhale 1 puff into the lungs daily.    Dispense:  60 each    Refill:  0    Order Specific Question:   Lot Number?    Answer:   [id number]    Order Specific Question:   Expiration Date?    Answer:   11/06/2022    Order Specific Question:   Manufacturer?    Answer:   GlaxoSmithKline [[id number]]    Order Specific Question:   Quantity    Answer:   3   albuterol (VENTOLIN HFA) 108 (90 Base) MCG/ACT inhaler    Sig: Inhale 2 puffs into the lungs every 6 (six) hours as needed for wheezing or shortness of breath.    Dispense:  8 g    Refill:  6   The patient was advised that for disability  determination he will need to contact the Social Security office and ask for the disability determination section.  Then a case can be opened for him.  We will see the patient in follow-up in 3 months time he is to contact 01/06/2023 prior to that time should any new difficulties arise.  [place], MD Advanced Bronchoscopy PCCM [facility]    *This note was dictated using voice recognition software/Dragon.  Despite best efforts to proofread, errors can occur which can change the meaning. Any transcriptional errors that result from this process are unintentional and may not be fully corrected at the time of dictation.

## 2021-07-13 ENCOUNTER — Ambulatory Visit (INDEPENDENT_AMBULATORY_CARE_PROVIDER_SITE_OTHER): Payer: Self-pay | Admitting: Internal Medicine

## 2021-07-13 VITALS — BP 142/68 | HR 100 | Temp 98.1°F | Resp 16 | Ht 71.0 in | Wt 152.3 lb

## 2021-07-13 DIAGNOSIS — I10 Essential (primary) hypertension: Secondary | ICD-10-CM

## 2021-07-13 MED ORDER — NEBIVOLOL HCL 10 MG PO TABS: 10.0000 mg | ORAL_TABLET | Freq: Every day | ORAL | 1 refills | Status: DC

## 2021-07-13 NOTE — Progress Notes (Signed)
Established Office Visit  Subjective:    Patient ID: Dakota Blackwell, male    DOB: [birth year], 61 y.o.   MRN: [id number]  Chief Complaint  Patient presents with   Follow-up   Hypertension    Pt states new medication showing improvement, denies rash or any sx.    HPI Patient is in today for follow up on blood pressure.   Hypertension: -Medications: Amlodipine 10 (history of ACEI but had facial swelling).  At his last visit HCTZ 12.5 mg daily was discontinued because of rash. He was started on Chlorothiazide 25 mg. He is doing well with this medication and states that the rash has resolved.  -Checking BP at home (average): 152-160/90 -Denies any SOB, CP, vision changes, LE edema or symptoms of hypotension -Diet: working on cutting down in sodium and has completely cut out alcohol  -Exercise: no routine but likes to walk  Severe COPD: -Following with Pulmonology, note reviewed from 07/10/21 -Asthma status: stable -Current Treatments: Trelegy, Albuterol PRN -Satisfied with current treatment?: yes -Influenza: Up to Date  Elevated LFT: -AST elevated to 41, ALT normal, Na 132 on CMP 3/23.  On recheck labs from 05/11/2021 liver enzymes returned back to normal, AST 23, ALT 14.  Total bilirubin slightly elevated at 1.3 and sodium slightly low at 133 -MCV elevated as well on CBC to 101.1, recheck at 100.7.  Vitamin B12, folate and thiamine all within normal limits. -Had been drinking multiple beers a day most days but has stopped drinking alcohol completely since our last visit   Health Maintenance: -Blood work UTD -Colon cancer screening due   Family History  Problem Relation Age of Onset   COPD Father    Cancer Father        bone    Social History   Socioeconomic History   Marital status: Single    Spouse name: Not on file   Number of children: Not on file   Years of education: Not on file   Highest education level: Not on file  Occupational History   Not on file   Tobacco Use   Smoking status: Former    Packs/day: 0.50    Years: 15.00    Total pack years: 7.50    Types: Cigarettes    Quit date: 2013    Years since quitting: 10.4   Smokeless tobacco: Never  Vaping Use   Vaping Use: Never used  Substance and Sexual Activity   Alcohol use: Never   Drug use: Never   Sexual activity: Not on file  Other Topics Concern   Not on file  Social History Narrative   Not on file   Social Determinants of Health   Financial Resource Strain: Low Risk  (04/13/2021)   Overall Financial Resource Strain (CARDIA)    Difficulty of Paying Living Expenses: Not hard at all  Food Insecurity: No Food Insecurity (04/13/2021)   Hunger Vital Sign    Worried About Running Out of Food in the Last Year: Never true    [place] in the Last Year: Never true  Transportation Needs: No Transportation Needs (04/13/2021)   PRAPARE - Hydrologist (Medical): No    Lack of Transportation (Non-Medical): No  Physical Activity: Inactive (04/13/2021)   Exercise Vital Sign    Days of Exercise per Week: 0 days    Minutes of Exercise per Session: 0 min  Stress: No Stress Concern Present (04/13/2021)   [employer] - Occupational Stress Questionnaire    Feeling of Stress : Not at all  Social Connections: Socially Isolated (04/13/2021)   Social Connection and Isolation Panel [NHANES]    Frequency of Communication with Friends and Family: Once a week    Frequency of Social Gatherings with Friends and Family: Once a week    Attends Religious Services: Never    Marine scientist or Organizations: No    Attends Archivist Meetings: Never    Marital Status: Never married  Intimate Partner Violence: Not At Risk (04/13/2021)   Humiliation, Afraid, Rape, and Kick questionnaire    Fear of Current or Ex-Partner: No    Emotionally Abused: No    Physically Abused: No    Sexually Abused: No    Outpatient Medications Prior  to Visit  Medication Sig Dispense Refill   albuterol (VENTOLIN HFA) 108 (90 Base) MCG/ACT inhaler Inhale 2 puffs into the lungs every 6 (six) hours as needed for wheezing or shortness of breath. 8 g 6   amLODipine (NORVASC) 10 MG tablet Take 1 tablet (10 mg total) by mouth daily. 90 tablet 0   chlorthalidone (HYGROTON) 25 MG tablet Take 1 tablet (25 mg total) by mouth daily. 30 tablet 1   fluticasone (FLONASE) 50 MCG/ACT nasal spray Place 2 sprays into both nostrils daily. 16 g 6   Fluticasone-Umeclidin-Vilant (TRELEGY ELLIPTA) 200-62.5-25 MCG/ACT AEPB Inhale 1 puff into the lungs daily. 60 each 11   Fluticasone-Umeclidin-Vilant (TRELEGY ELLIPTA) 200-62.5-25 MCG/ACT AEPB Inhale 1 puff into the lungs daily. 60 each 0   No facility-administered medications prior to visit.    Allergies  Allergen Reactions   Ace Inhibitors Swelling    Review of Systems  Constitutional:  Negative for chills and fever.  Eyes:  Negative for visual disturbance.  Respiratory:  Negative for cough, shortness of breath and wheezing.   Cardiovascular:  Negative for chest pain, palpitations and leg swelling.  Gastrointestinal:  Negative for abdominal pain.  Skin:  Positive for rash.  Neurological:  Negative for dizziness and headaches.       Objective:    Physical Exam Constitutional:      Appearance: Normal appearance.  HENT:     Head: Normocephalic and atraumatic.  Eyes:     Conjunctiva/sclera: Conjunctivae normal.  Cardiovascular:     Rate and Rhythm: Normal rate and regular rhythm.  Pulmonary:     Effort: Pulmonary effort is normal.     Breath sounds: Normal breath sounds.  Musculoskeletal:     Right lower leg: No edema.     Left lower leg: No edema.  Skin:    General: Skin is warm and dry.     Comments: Small bumps over eyebrows, no erythema, no current infection, no blisters or drainage.   Neurological:     General: No focal deficit present.     Mental Status: He is alert. Mental status is  at baseline.  Psychiatric:        Mood and Affect: Mood normal.        Behavior: Behavior normal.     BP (!) 142/68   Pulse 100   Temp 98.1 F (36.7 C) (Oral)   Resp 16   Ht 5' 11"  (1.803 m)   Wt 152 lb 4.8 oz (69.1 kg)   SpO2 95%   BMI 21.24 kg/m  Wt Readings from Last 3 Encounters:  07/13/21 152 lb 4.8 oz (69.1 kg)  07/10/21 152 lb 12.8 oz (69.3 kg)  06/13/21 155 lb 14.4 oz (70.7 kg)   Vitals:   07/13/21 1416 07/13/21 1449  BP: (!) 160/78 (!) 142/68     Health Maintenance Due  Topic Date Due   Zoster Vaccines- Shingrix (1 of 2) Never done     There are no preventive care reminders to display for this patient.   No results found for: "TSH" Lab Results  Component Value Date   WBC 11.7 (H) 05/11/2021   HGB 14.2 05/11/2021   HCT 41.1 05/11/2021   MCV 100.7 (H) 05/11/2021   PLT 192 05/11/2021   Lab Results  Component Value Date   NA 133 (L) 05/11/2021   K 4.0 05/11/2021   CO2 29 05/11/2021   GLUCOSE 94 05/11/2021   BUN 11 05/11/2021   CREATININE 0.85 05/11/2021   BILITOT 1.3 (H) 05/11/2021   AST 23 05/11/2021   ALT 14 05/11/2021   PROT 7.3 05/11/2021   CALCIUM 9.6 05/11/2021   EGFR 99 05/11/2021   Lab Results  Component Value Date   CHOL 194 04/13/2021   Lab Results  Component Value Date   HDL 114 04/13/2021   Lab Results  Component Value Date   LDLCALC 66 04/13/2021   Lab Results  Component Value Date   TRIG 55 04/13/2021   Lab Results  Component Value Date   CHOLHDL 1.7 04/13/2021   No results found for: "HGBA1C"     Assessment & Plan:   Problem List Items Addressed This Visit       Cardiovascular and Mediastinum   Hypertension - Primary    Blood pressure still not controlled. Rash resolved, continue Amlodipine 10 mg, Chlorthalidone 25 mg . New medication Bystolic 10 mg sent to pharmacy today. Continue to check BP at home. Follow up in 1 month.       Relevant Medications   nebivolol (BYSTOLIC) 10 MG tablet    Teodora Medici, DO

## 2021-07-13 NOTE — Assessment & Plan Note (Signed)
Blood pressure still not controlled. Rash resolved, continue Amlodipine 10 mg, Chlorthalidone 25 mg . New medication Bystolic 10 mg sent to pharmacy today. Continue to check BP at home. Follow up in 1 month.

## 2021-07-13 NOTE — Patient Instructions (Addendum)
It was great seeing you today!  Plan discussed at today's visit: -Continue Amlodipine 10 mg, Chlorothiazide 25 mg, new medication Bystolic 10 mg added to regimen. Take all of these medication daily.   Follow up in: 1 month   Take care and let us know if you have any questions or concerns prior to your next visit.  Dr. Caralee Ates  How to Take Your Blood Pressure Blood pressure measures how strongly your blood is pressing against the walls of your arteries. Arteries are blood vessels that carry blood from your heart throughout your body. You can take your blood pressure at home with a machine. You may need to check your blood pressure at home: To check if you have high blood pressure (hypertension). To check your blood pressure over time. To make sure your blood pressure medicine is working. Supplies needed: Blood pressure machine, or monitor. A chair to sit in. This should be a chair where you can sit upright with your back supported. Do not sit on a soft couch or an armchair. Table or desk. Small notebook. Pencil or pen. How to prepare Avoid these things for 30 minutes before checking your blood pressure: Having drinks with caffeine in them, such as coffee or tea. Drinking alcohol. Eating. Smoking. Exercising. Do these things five minutes before checking your blood pressure: Go to the bathroom and pee (urinate). Sit in a chair. Be quiet. Do not talk. How to take your blood pressure Follow the instructions that came with your machine. If you have a digital blood pressure monitor, these may be the instructions: Sit up straight. Place your feet on the floor. Do not cross your ankles or legs. Rest your left arm at the level of your heart. You may rest it on a table, desk, or chair. Pull up your shirt sleeve. Wrap the blood pressure cuff around the upper part of your left arm. The cuff should be 1 inch (2.5 cm) above your elbow. It is best to wrap the cuff around bare skin. Fit the  cuff snugly around your arm, but not too tightly. You should be able to place only one finger between the cuff and your arm. Place the cord so that it rests in the bend of your elbow. Press the power button. Sit quietly while the cuff fills with air and loses air. Write down the numbers on the screen. Wait 2-3 minutes and then repeat steps 1-10. What do the numbers mean? Two numbers make up your blood pressure. The first number is called systolic pressure. The second is called diastolic pressure. An example of a blood pressure reading is "120 over 80" (or 120/80). If you are an adult and do not have a medical condition, use this guide to find out if your blood pressure is normal: Normal First number: below 120. Second number: below 80. Elevated First number: 120-129. Second number: below 80. Hypertension stage 1 First number: 130-139. Second number: 80-89. Hypertension stage 2 First number: 140 or above. Second number: 90 or above. Your blood pressure is above normal even if only the first or only the second number is above normal. Follow these instructions at home: Medicines Take over-the-counter and prescription medicines only as told by your doctor. Tell your doctor if your medicine is causing side effects. General instructions Check your blood pressure as often as your doctor tells you to. Check your blood pressure at the same time every day. Take your monitor to your next doctor's appointment. Your doctor will: Make sure  you are using it correctly. Make sure it is working right. Understand what your blood pressure numbers should be. Keep all follow-up visits. General tips You will need a blood pressure machine or monitor. Your doctor can suggest a monitor. You can buy one at a drugstore or online. When choosing one: Choose one with an arm cuff. Choose one that wraps around your upper arm. Only one finger should fit between your arm and the cuff. Do not choose one that  measures your blood pressure from your wrist or finger. Where to find more information American Heart Association: www.heart.org Contact a doctor if: Your blood pressure keeps being high. Your blood pressure is suddenly low. Get help right away if: Your first blood pressure number is higher than 180. Your second blood pressure number is higher than 120. These symptoms may be an emergency. Do not wait to see if the symptoms will go away. Get help right away. Call 911. Summary Check your blood pressure at the same time every day. Avoid caffeine, alcohol, smoking, and exercise for 30 minutes before checking your blood pressure. Make sure you understand what your blood pressure numbers should be. This information is not intended to replace advice given to you by your health care provider. Make sure you discuss any questions you have with your health care provider. Document Revised: 10/06/2020 Document Reviewed: 10/06/2020 Elsevier Patient Education  2023 [employer].

## 2021-07-14 ENCOUNTER — Telehealth: Payer: Self-pay | Admitting: Pulmonary Disease

## 2021-07-14 NOTE — Telephone Encounter (Signed)
Okay to state that he has severe stage IV COPD.

## 2021-07-14 NOTE — Telephone Encounter (Signed)
Dr. Jayme Cloud, please advise if okay to write a letter stating patient's dx.

## 2021-07-17 ENCOUNTER — Telehealth: Payer: Self-pay

## 2021-07-17 LAB — ALPHA-1-ANTITRYPSIN PHENOTYP: A-1 Antitrypsin, Ser: 154 mg/dL (ref 101–187)

## 2021-07-17 MED ORDER — TRELEGY ELLIPTA 200-62.5-25 MCG/ACT IN AEPB: 1.0000 | INHALATION_SPRAY | Freq: Every day | RESPIRATORY_TRACT | 11 refills | Status: DC

## 2021-07-17 NOTE — Telephone Encounter (Addendum)
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 Form and trelegy Rx has been placed in Dr. Georgann Housekeeper folder for signature.

## 2021-07-17 NOTE — Telephone Encounter (Signed)
Letter has been written and placed up front for pickup. Patient's mother, Loren Racer is aware and voiced her understanding.  Nothing further needed.

## 2021-07-21 NOTE — Telephone Encounter (Signed)
Rx and applications has been faxed to [employer] received fax confirmation. Nothing further needed.

## 2021-08-05 ENCOUNTER — Other Ambulatory Visit: Payer: Self-pay | Admitting: Internal Medicine

## 2021-08-05 DIAGNOSIS — I10 Essential (primary) hypertension: Secondary | ICD-10-CM

## 2021-08-07 NOTE — Telephone Encounter (Signed)
Requested Prescriptions  Pending Prescriptions Disp Refills  . chlorthalidone (HYGROTON) 25 MG tablet [Pharmacy Med Name: CHLORTHALIDONE 25MG  TABLETS] 90 tablet 1    Sig: TAKE 1 TABLET(25 MG) BY MOUTH DAILY     Cardiovascular: Diuretics - Thiazide Failed - 08/05/2021  3:15 AM      Failed - Na in normal range and within 180 days    Sodium  Date Value Ref Range Status  05/11/2021 133 (L) 135 - 146 mmol/L Final         Failed - Last BP in normal range    BP Readings from Last 1 Encounters:  07/13/21 (!) 142/68         Passed - Cr in normal range and within 180 days    Creat  Date Value Ref Range Status  05/11/2021 0.85 0.70 - 1.35 mg/dL Final         Passed - K in normal range and within 180 days    Potassium  Date Value Ref Range Status  05/11/2021 4.0 3.5 - 5.3 mmol/L Final         Passed - Valid encounter within last 6 months    Recent Outpatient Visits          3 weeks ago Hypertension, unspecified type   [facility], DO   1 month ago Hypertension, unspecified type   [facility], DO   2 months ago Hypertension, unspecified type   [facility], DO   3 months ago Encounter for general adult medical examination with abnormal findings   [facility], DO   4 months ago Hypertension, unspecified type   [facility], DO      Future Appointments            In 3 weeks Margarita Mail, DO [facility], [facility]

## 2021-08-17 ENCOUNTER — Other Ambulatory Visit: Payer: Self-pay | Admitting: Internal Medicine

## 2021-08-17 DIAGNOSIS — I10 Essential (primary) hypertension: Secondary | ICD-10-CM

## 2021-08-17 NOTE — Telephone Encounter (Signed)
Requested Prescriptions  Pending Prescriptions Disp Refills  . amLODipine (NORVASC) 10 MG tablet [Pharmacy Med Name: AMLODIPINE BESYLATE 10MG  TABLETS] 90 tablet 0    Sig: TAKE 1 TABLET(10 MG) BY MOUTH DAILY     Cardiovascular: Calcium Channel Blockers 2 Failed - 08/17/2021  7:06 AM      Failed - Last BP in normal range    BP Readings from Last 1 Encounters:  07/13/21 (!) 142/68         Passed - Last Heart Rate in normal range    Pulse Readings from Last 1 Encounters:  07/13/21 100         Passed - Valid encounter within last 6 months    Recent Outpatient Visits          1 month ago Hypertension, unspecified type   [facility], DO   2 months ago Hypertension, unspecified type   [facility], DO   3 months ago Hypertension, unspecified type   [facility], DO   4 months ago Encounter for general adult medical examination with abnormal findings   [facility], DO   5 months ago Hypertension, unspecified type   [facility], DO      Future Appointments            In 3 weeks Margarita Mail, DO [facility], [facility]

## 2021-08-31 ENCOUNTER — Ambulatory Visit: Payer: Medicaid Other | Admitting: Internal Medicine

## 2021-09-02 ENCOUNTER — Other Ambulatory Visit: Payer: Self-pay | Admitting: Internal Medicine

## 2021-09-02 DIAGNOSIS — I10 Essential (primary) hypertension: Secondary | ICD-10-CM

## 2021-09-04 NOTE — Telephone Encounter (Signed)
Requested Prescriptions  Pending Prescriptions Disp Refills  . nebivolol (BYSTOLIC) 10 MG tablet [Pharmacy Med Name: NEBIVOLOL 10MG  TABLETS] 90 tablet 1    Sig: TAKE 1 TABLET(10 MG) BY MOUTH DAILY     Cardiovascular: Beta Blockers 3 Failed - 09/02/2021  7:13 AM      Failed - Last BP in normal range    BP Readings from Last 1 Encounters:  07/13/21 (!) 142/68         Passed - Cr in normal range and within 360 days    Creat  Date Value Ref Range Status  05/11/2021 0.85 0.70 - 1.35 mg/dL Final         Passed - AST in normal range and within 360 days    AST  Date Value Ref Range Status  05/11/2021 23 10 - 35 U/L Final         Passed - ALT in normal range and within 360 days    ALT  Date Value Ref Range Status  05/11/2021 14 9 - 46 U/L Final         Passed - Last Heart Rate in normal range    Pulse Readings from Last 1 Encounters:  07/13/21 100         Passed - Valid encounter within last 6 months    Recent Outpatient Visits          1 month ago Hypertension, unspecified type   [facility], DO   2 months ago Hypertension, unspecified type   [facility], DO   3 months ago Hypertension, unspecified type   [facility], DO   4 months ago Encounter for general adult medical examination with abnormal findings   [facility], DO   5 months ago Hypertension, unspecified type   [facility], DO      Future Appointments            In 3 days Margarita Mail, DO [facility], [facility]

## 2021-09-07 ENCOUNTER — Ambulatory Visit: Payer: Self-pay | Admitting: Internal Medicine

## 2021-09-07 ENCOUNTER — Encounter: Payer: Self-pay | Admitting: Internal Medicine

## 2021-09-07 VITALS — BP 142/72 | HR 80 | Temp 97.8°F | Resp 16 | Ht 71.0 in | Wt 156.9 lb

## 2021-09-07 DIAGNOSIS — J449 Chronic obstructive pulmonary disease, unspecified: Secondary | ICD-10-CM

## 2021-09-07 DIAGNOSIS — I10 Essential (primary) hypertension: Secondary | ICD-10-CM

## 2021-09-07 MED ORDER — AMLODIPINE BESYLATE 10 MG PO TABS: ORAL_TABLET | ORAL | 0 refills | Status: DC

## 2021-09-07 MED ORDER — CHLORTHALIDONE 25 MG PO TABS: ORAL_TABLET | ORAL | 0 refills | Status: DC

## 2021-09-07 MED ORDER — NEBIVOLOL HCL 20 MG PO TABS: 20.0000 mg | ORAL_TABLET | Freq: Every day | ORAL | 0 refills | Status: DC

## 2021-09-07 NOTE — Patient Instructions (Addendum)
It was great seeing you today!  Plan discussed at today's visit: -Blood pressure medications refilled, Bystolic increased to 20 mg  -Continue Trelegy daily, Albuterol as needed  Follow up in: 2 months   Take care and let us know if you have any questions or concerns prior to your next visit.  Dr. Caralee Ates

## 2021-09-07 NOTE — Progress Notes (Signed)
Established Office Visit  Subjective:    Patient ID: Dakota Blackwell, male    DOB: May 16, 1960, 61 y.o.   MRN: [id number]  Chief Complaint  Patient presents with   Follow-up   Hypertension   Asthma    HPI Patient is in today for follow up on blood pressure.   Hypertension: -Medications: Amlodipine 10 mg, Chlorothiazide 25 mg, Bystolic 10 mg added at LOV Medications Failed: ACEI caused facial swelling, HCTZ caused rash -Checking BP at home (average): 145-150/90 -Denies any SOB, CP, vision changes, LE edema or symptoms of hypotension. New medication without side effects  -Diet: working on cutting down in sodium and has completely cut out alcohol  -Exercise: no routine but likes to walk  Severe COPD: -Following with Pulmonology, note reviewed from 07/10/21 -Asthma status: stable -Current Treatments: Trelegy, Albuterol PRN -Satisfied with current treatment?: yes -Influenza: Up to Date  Elevated LFT: -AST had been elevated to 41, ALT normal, Na 132 on CMP 3/23.  On recheck labs from 05/11/2021 liver enzymes returned back to normal, AST 23, ALT 14.  Total bilirubin slightly elevated at 1.3 and sodium slightly low at 133 -MCV elevated as well on CBC to 101.1, recheck at 100.7.  Vitamin B12, folate and thiamine all within normal limits. -Had been drinking multiple beers a day most days but has stopped drinking alcohol completely since our last visit, continues to maintain this.  Health Maintenance: -Blood work UTD -Colon cancer screening due, awaiting cologuard  Family History  Problem Relation Age of Onset   COPD Father    Cancer Father        bone    Social History   Socioeconomic History   Marital status: Single    Spouse name: Not on file   Number of children: Not on file   Years of education: Not on file   Highest education level: Not on file  Occupational History   Not on file  Tobacco Use   Smoking status: Former    Packs/day: 0.50    Years: 15.00    Total  pack years: 7.50    Types: Cigarettes    Quit date: 2013    Years since quitting: 10.5   Smokeless tobacco: Never  Vaping Use   Vaping Use: Never used  Substance and Sexual Activity   Alcohol use: Never   Drug use: Never   Sexual activity: Not on file  Other Topics Concern   Not on file  Social History Narrative   Not on file   Social Determinants of Health   Financial Resource Strain: Low Risk  (04/13/2021)   Overall Financial Resource Strain (CARDIA)    Difficulty of Paying Living Expenses: Not hard at all  Food Insecurity: No Food Insecurity (04/13/2021)   Hunger Vital Sign    Worried About Running Out of Food in the Last Year: Never true    [place] in the Last Year: Never true  Transportation Needs: No Transportation Needs (04/13/2021)   PRAPARE - Hydrologist (Medical): No    Lack of Transportation (Non-Medical): No  Physical Activity: Inactive (04/13/2021)   Exercise Vital Sign    Days of Exercise per Week: 0 days    Minutes of Exercise per Session: 0 min  Stress: No Stress Concern Present (04/13/2021)   [place]    Feeling of Stress : Not at all  Social Connections: Socially Isolated (04/13/2021)  Social Licensed conveyancer [NHANES]    Frequency of Communication with Friends and Family: Once a week    Frequency of Social Gatherings with Friends and Family: Once a week    Attends Religious Services: Never    Marine scientist or Organizations: No    Attends Archivist Meetings: Never    Marital Status: Never married  Intimate Partner Violence: Not At Risk (04/13/2021)   Humiliation, Afraid, Rape, and Kick questionnaire    Fear of Current or Ex-Partner: No    Emotionally Abused: No    Physically Abused: No    Sexually Abused: No    Outpatient Medications Prior to Visit  Medication Sig Dispense Refill   albuterol (VENTOLIN HFA) 108 (90 Base)  MCG/ACT inhaler Inhale 2 puffs into the lungs every 6 (six) hours as needed for wheezing or shortness of breath. 8 g 6   amLODipine (NORVASC) 10 MG tablet TAKE 1 TABLET(10 MG) BY MOUTH DAILY 90 tablet 0   chlorthalidone (HYGROTON) 25 MG tablet TAKE 1 TABLET(25 MG) BY MOUTH DAILY 90 tablet 1   fluticasone (FLONASE) 50 MCG/ACT nasal spray Place 2 sprays into both nostrils daily. 16 g 6   Fluticasone-Umeclidin-Vilant (TRELEGY ELLIPTA) 200-62.5-25 MCG/ACT AEPB Inhale 1 puff into the lungs daily. 60 each 0   Fluticasone-Umeclidin-Vilant (TRELEGY ELLIPTA) 200-62.5-25 MCG/ACT AEPB Inhale 1 puff into the lungs daily. 60 each 11   nebivolol (BYSTOLIC) 10 MG tablet TAKE 1 TABLET(10 MG) BY MOUTH DAILY 90 tablet 1   No facility-administered medications prior to visit.    Allergies  Allergen Reactions   Ace Inhibitors Swelling    Review of Systems  Constitutional:  Negative for chills and fever.  Eyes:  Negative for visual disturbance.  Respiratory:  Negative for cough, shortness of breath and wheezing.   Cardiovascular:  Negative for chest pain, palpitations and leg swelling.  Gastrointestinal:  Negative for abdominal pain.  Neurological:  Negative for dizziness and headaches.       Objective:    Physical Exam Constitutional:      Appearance: Normal appearance.  HENT:     Head: Normocephalic and atraumatic.  Eyes:     Conjunctiva/sclera: Conjunctivae normal.  Cardiovascular:     Rate and Rhythm: Normal rate and regular rhythm.  Pulmonary:     Effort: Pulmonary effort is normal.     Breath sounds: Normal breath sounds.  Musculoskeletal:     Right lower leg: No edema.     Left lower leg: No edema.  Skin:    General: Skin is warm and dry.     Comments: Small bumps over eyebrows, no erythema, no current infection, no blisters or drainage.   Neurological:     General: No focal deficit present.     Mental Status: He is alert. Mental status is at baseline.  Psychiatric:        Mood and  Affect: Mood normal.        Behavior: Behavior normal.     BP (!) 142/72   Pulse 80   Temp 97.8 F (36.6 C)   Resp 16   Ht _0  (1.803 m)   Wt 156 lb 14.4 oz (71.2 kg)   SpO2 96%   BMI 21.88 kg/m  Wt Readings from Last 3 Encounters:  09/07/21 156 lb 14.4 oz (71.2 kg)  07/13/21 152 lb 4.8 oz (69.1 kg)  07/10/21 152 lb 12.8 oz (69.3 kg)   Vitals:   09/07/21 1340  BP: Marland Kitchen)  142/72     Health Maintenance Due  Topic Date Due   Zoster Vaccines- Shingrix (1 of 2) Never done   INFLUENZA VACCINE  09/05/2021     There are no preventive care reminders to display for this patient.   No results found for: "TSH" Lab Results  Component Value Date   WBC 11.7 (H) 05/11/2021   HGB 14.2 05/11/2021   HCT 41.1 05/11/2021   MCV 100.7 (H) 05/11/2021   PLT 192 05/11/2021   Lab Results  Component Value Date   NA 133 (L) 05/11/2021   K 4.0 05/11/2021   CO2 29 05/11/2021   GLUCOSE 94 05/11/2021   BUN 11 05/11/2021   CREATININE 0.85 05/11/2021   BILITOT 1.3 (H) 05/11/2021   AST 23 05/11/2021   ALT 14 05/11/2021   PROT 7.3 05/11/2021   CALCIUM 9.6 05/11/2021   EGFR 99 05/11/2021   Lab Results  Component Value Date   CHOL 194 04/13/2021   Lab Results  Component Value Date   HDL 114 04/13/2021   Lab Results  Component Value Date   LDLCALC 66 04/13/2021   Lab Results  Component Value Date   TRIG 55 04/13/2021   Lab Results  Component Value Date   CHOLHDL 1.7 04/13/2021   No results found for: "HGBA1C"     Assessment & Plan:   1. Hypertension, unspecified type: Blood pressure better today, however it is still above goal.  We will increase his Bystolic to 20 mg daily.  Continue amlodipine 10 mg and chlorthalidone 25 mg daily, these medications were refilled today.  He will continue to monitor his blood pressure at home and continue his low-sodium diet.  Follow-up in 2 months for recheck.  - amLODipine (NORVASC) 10 MG tablet; TAKE 1 TABLET(10 MG) BY MOUTH DAILY   Dispense: 90 tablet; Refill: 0 - chlorthalidone (HYGROTON) 25 MG tablet; TAKE 1 TABLET(25 MG) BY MOUTH DAILY  Dispense: 90 tablet; Refill: 0 - Nebivolol HCl 20 MG TABS; Take 1 tablet (20 mg total) by mouth daily.  Dispense: 90 tablet; Refill: 0  2. Chronic obstructive pulmonary disease, unspecified COPD type ([place]): Stable.  No respiratory symptoms currently.  He was given a sample of Trelegy inhaler today to use on a daily basis.  We will continue to use his albuterol inhaler as needed.  Follow up: 2 months  Teodora Medici, DO

## 2021-10-12 ENCOUNTER — Ambulatory Visit: Payer: Medicaid Other | Admitting: Pulmonary Disease

## 2021-10-18 ENCOUNTER — Telehealth: Payer: Self-pay | Admitting: Internal Medicine

## 2021-10-18 NOTE — Telephone Encounter (Signed)
Copied from CRM [id number]. Topic: General - Other >> Oct 18, 2021  1:46 PM Esperanza Sheets wrote: Pt inquiring if provider has any Fluticasone-Umeclidin-Vilant (TRELEGY ELLIPTA) samples  Please fu w/ pt

## 2021-10-18 NOTE — Telephone Encounter (Signed)
We have no samples 

## 2021-11-02 ENCOUNTER — Ambulatory Visit (INDEPENDENT_AMBULATORY_CARE_PROVIDER_SITE_OTHER): Payer: Self-pay

## 2021-11-02 DIAGNOSIS — Z23 Encounter for immunization: Secondary | ICD-10-CM

## 2021-11-07 ENCOUNTER — Ambulatory Visit (INDEPENDENT_AMBULATORY_CARE_PROVIDER_SITE_OTHER): Payer: Self-pay | Admitting: Internal Medicine

## 2021-11-07 ENCOUNTER — Encounter: Payer: Self-pay | Admitting: Internal Medicine

## 2021-11-07 VITALS — BP 145/70 | HR 74 | Temp 98.1°F | Resp 16 | Ht 71.0 in | Wt 149.5 lb

## 2021-11-07 DIAGNOSIS — J449 Chronic obstructive pulmonary disease, unspecified: Secondary | ICD-10-CM

## 2021-11-07 DIAGNOSIS — I10 Essential (primary) hypertension: Secondary | ICD-10-CM

## 2021-11-07 MED ORDER — NEBIVOLOL HCL 20 MG PO TABS: 20.0000 mg | ORAL_TABLET | Freq: Two times a day (BID) | ORAL | 1 refills | Status: DC

## 2021-11-07 NOTE — Patient Instructions (Addendum)
It was great seeing you today!  Plan discussed at today's visit: -For blood pressure: take Chlorothiazide 25 mg and Bystolic 20 mg in the morning, then take Amlodipine 10 mg and second Bystolic 20 mg in the evening  -Continue checking blood pressure at home, goal is to keep blood pressure <140/90  Follow up in: 1 month   Take care and let us know if you have any questions or concerns prior to your next visit.  Dr. Rosana Berger

## 2021-11-07 NOTE — Progress Notes (Signed)
Established Office Visit  Subjective:    Patient ID: Dakota Blackwell, male    DOB: [birth year], 61 y.o.   MRN: [id number]  Chief Complaint  Patient presents with   Follow-up   Hypertension    HPI Patient is in today for follow up on blood pressure.  Hypertension: -Medications: Amlodipine 10 mg, Chlorothiazide 25 mg, Bystolic increased to 20 mg daily at LOV Medications Failed: ACEI caused facial swelling, HCTZ caused rash -Checking BP at home (average): got a new home cuff but it's not accurate up and down -Denies any SOB, CP, vision changes, LE edema or symptoms of hypotension. New medication without side effects  -Diet: working on cutting down in sodium and has completely cut out alcohol  -Exercise: no routine but likes to walk  Severe COPD: -Following with Pulmonology, note reviewed from 07/10/21 -Asthma status: stable -Current Treatments: Trelegy, Albuterol PRN -Satisfied with current treatment?: yes -Influenza: Up to Date, politely declines Pneumonia vaccine   Elevated LFT: -AST had been elevated to 41, ALT normal, Na 132 on CMP 3/23.  On recheck labs from 05/11/2021 liver enzymes returned back to normal, AST 23, ALT 14.  Total bilirubin slightly elevated at 1.3 and sodium slightly low at 133 -MCV elevated as well on CBC to 101.1, recheck at 100.7.  Vitamin B12, folate and thiamine all within normal limits. -Had been drinking multiple beers a day most days but has stopped drinking alcohol completely since our last visit, continues to maintain this.  Health Maintenance: -Blood work UTD -Colon cancer screening due, awaiting cologuard  Family History  Problem Relation Age of Onset   COPD Father    Cancer Father        bone    Social History   Socioeconomic History   Marital status: Single    Spouse name: Not on file   Number of children: Not on file   Years of education: Not on file   Highest education level: Not on file  Occupational History   Not on file   Tobacco Use   Smoking status: Former    Packs/day: 0.50    Years: 15.00    Total pack years: 7.50    Types: Cigarettes    Quit date: 2013    Years since quitting: 10.7   Smokeless tobacco: Never  Vaping Use   Vaping Use: Never used  Substance and Sexual Activity   Alcohol use: Never   Drug use: Never   Sexual activity: Not on file  Other Topics Concern   Not on file  Social History Narrative   Not on file   Social Determinants of Health   Financial Resource Strain: Low Risk  (04/13/2021)   Overall Financial Resource Strain (CARDIA)    Difficulty of Paying Living Expenses: Not hard at all  Food Insecurity: No Food Insecurity (04/13/2021)   Hunger Vital Sign    Worried About Running Out of Food in the Last Year: Never true    [place] in the Last Year: Never true  Transportation Needs: No Transportation Needs (04/13/2021)   PRAPARE - Hydrologist (Medical): No    Lack of Transportation (Non-Medical): No  Physical Activity: Inactive (04/13/2021)   Exercise Vital Sign    Days of Exercise per Week: 0 days    Minutes of Exercise per Session: 0 min  Stress: No Stress Concern Present (04/13/2021)   [place]  of Stress : Not at all  Social Connections: Socially Isolated (04/13/2021)   Social Connection and Isolation Panel [NHANES]    Frequency of Communication with Friends and Family: Once a week    Frequency of Social Gatherings with Friends and Family: Once a week    Attends Religious Services: Never    Marine scientist or Organizations: No    Attends Archivist Meetings: Never    Marital Status: Never married  Intimate Partner Violence: Not At Risk (04/13/2021)   Humiliation, Afraid, Rape, and Kick questionnaire    Fear of Current or Ex-Partner: No    Emotionally Abused: No    Physically Abused: No    Sexually Abused: No    Outpatient Medications Prior  to Visit  Medication Sig Dispense Refill   albuterol (VENTOLIN HFA) 108 (90 Base) MCG/ACT inhaler Inhale 2 puffs into the lungs every 6 (six) hours as needed for wheezing or shortness of breath. 8 g 6   amLODipine (NORVASC) 10 MG tablet TAKE 1 TABLET(10 MG) BY MOUTH DAILY 90 tablet 0   chlorthalidone (HYGROTON) 25 MG tablet TAKE 1 TABLET(25 MG) BY MOUTH DAILY 90 tablet 0   fluticasone (FLONASE) 50 MCG/ACT nasal spray Place 2 sprays into both nostrils daily. 16 g 6   Fluticasone-Umeclidin-Vilant (TRELEGY ELLIPTA) 200-62.5-25 MCG/ACT AEPB Inhale 1 puff into the lungs daily. 60 each 0   Fluticasone-Umeclidin-Vilant (TRELEGY ELLIPTA) 200-62.5-25 MCG/ACT AEPB Inhale 1 puff into the lungs daily. 60 each 11   Nebivolol HCl 20 MG TABS Take 1 tablet (20 mg total) by mouth daily. 90 tablet 0   No facility-administered medications prior to visit.    Allergies  Allergen Reactions   Ace Inhibitors Swelling    Review of Systems  Constitutional:  Negative for chills and fever.  Eyes:  Negative for visual disturbance.  Respiratory:  Negative for cough, shortness of breath and wheezing.   Cardiovascular:  Negative for chest pain, palpitations and leg swelling.  Gastrointestinal:  Negative for abdominal pain.  Neurological:  Negative for dizziness and headaches.       Objective:    Physical Exam Constitutional:      Appearance: Normal appearance.  HENT:     Head: Normocephalic and atraumatic.  Eyes:     Conjunctiva/sclera: Conjunctivae normal.  Cardiovascular:     Rate and Rhythm: Normal rate and regular rhythm.  Pulmonary:     Effort: Pulmonary effort is normal.     Breath sounds: Normal breath sounds.  Skin:    General: Skin is warm and dry.     Comments:    Neurological:     General: No focal deficit present.     Mental Status: He is alert. Mental status is at baseline.  Psychiatric:        Mood and Affect: Mood normal.        Behavior: Behavior normal.     BP (!) 145/70    Pulse 74   Temp 98.1 F (36.7 C)   Resp 16   Ht 5' 11"  (1.803 m)   Wt 149 lb 8 oz (67.8 kg)   SpO2 93%   BMI 20.85 kg/m  Wt Readings from Last 3 Encounters:  11/07/21 149 lb 8 oz (67.8 kg)  09/07/21 156 lb 14.4 oz (71.2 kg)  07/13/21 152 lb 4.8 oz (69.1 kg)   Vitals:   11/07/21 1412 11/07/21 1447  BP: (!) 162/82 (!) 145/70     Health Maintenance Due  Topic Date Due  Zoster Vaccines- Shingrix (1 of 2) Never done     There are no preventive care reminders to display for this patient.   No results found for: "TSH" Lab Results  Component Value Date   WBC 11.7 (H) 05/11/2021   HGB 14.2 05/11/2021   HCT 41.1 05/11/2021   MCV 100.7 (H) 05/11/2021   PLT 192 05/11/2021   Lab Results  Component Value Date   NA 133 (L) 05/11/2021   K 4.0 05/11/2021   CO2 29 05/11/2021   GLUCOSE 94 05/11/2021   BUN 11 05/11/2021   CREATININE 0.85 05/11/2021   BILITOT 1.3 (H) 05/11/2021   AST 23 05/11/2021   ALT 14 05/11/2021   PROT 7.3 05/11/2021   CALCIUM 9.6 05/11/2021   EGFR 99 05/11/2021   Lab Results  Component Value Date   CHOL 194 04/13/2021   Lab Results  Component Value Date   HDL 114 04/13/2021   Lab Results  Component Value Date   LDLCALC 66 04/13/2021   Lab Results  Component Value Date   TRIG 55 04/13/2021   Lab Results  Component Value Date   CHOLHDL 1.7 04/13/2021   No results found for: "HGBA1C"     Assessment & Plan:   1. Hypertension, unspecified type: Blood pressure overall better but still uncontrolled, continue Amlodipine 10 mg, Chlorothiazide 25 mg and increase Bystolic to 20 mg BID. Continue to monitor at home, patient monitoring diet.   - Nebivolol HCl 20 MG TABS; Take 1 tablet (20 mg total) by mouth 2 (two) times daily.  Dispense: 90 tablet; Refill: 1  2. Chronic obstructive pulmonary disease, unspecified COPD type ([place]): Stable, doing well with Trelegy but has a hard time affording it, given 2 samples today.   Teodora Medici, DO

## 2021-11-17 ENCOUNTER — Other Ambulatory Visit: Payer: Self-pay | Admitting: Internal Medicine

## 2021-11-17 DIAGNOSIS — I10 Essential (primary) hypertension: Secondary | ICD-10-CM

## 2021-11-17 NOTE — Telephone Encounter (Signed)
Requested Prescriptions  Pending Prescriptions Disp Refills  . amLODipine (NORVASC) 10 MG tablet [Pharmacy Med Name: AMLODIPINE BESYLATE 10MG  TABLETS] 90 tablet 0    Sig: TAKE 1 TABLET(10 MG) BY MOUTH DAILY     Cardiovascular: Calcium Channel Blockers 2 Failed - 11/17/2021  1:13 PM      Failed - Last BP in normal range    BP Readings from Last 1 Encounters:  11/07/21 (!) 145/70         Passed - Last Heart Rate in normal range    Pulse Readings from Last 1 Encounters:  11/07/21 74         Passed - Valid encounter within last 6 months    Recent Outpatient Visits          1 week ago Hypertension, unspecified type   [place], DO   2 months ago Hypertension, unspecified type   [place], DO   4 months ago Hypertension, unspecified type   [place], DO   5 months ago Hypertension, unspecified type   [place], DO   6 months ago Hypertension, unspecified type   [facility] Teodora Medici, DO      Future Appointments            In 2 weeks Teodora Medici, [facility], [facility]

## 2021-12-07 ENCOUNTER — Ambulatory Visit: Payer: Medicaid Other | Admitting: Internal Medicine

## 2021-12-07 NOTE — Progress Notes (Deleted)
Established Office Visit  Subjective:    Patient ID: Dakota Blackwell, male    DOB: [birth year], 61 y.o.   MRN: [id number]  No chief complaint on file.   HPI Patient is in today for follow up on blood pressure.  Hypertension: -Medications: Amlodipine 10 mg, Chlorothiazide 25 mg, Bystolic increased to 20 mg BID at LOV Medications Failed: ACEI caused facial swelling, HCTZ caused rash -Checking BP at home (average): got a new home cuff but it's not accurate up and down -Denies any SOB, CP, vision changes, LE edema or symptoms of hypotension. New medication without side effects  -Diet: working on cutting down in sodium and has completely cut out alcohol  -Exercise: no routine but likes to walk  Severe COPD: -Following with Pulmonology, note reviewed from 07/10/21 -Asthma status: stable -Current Treatments: Trelegy, Albuterol PRN -Satisfied with current treatment?: yes -Influenza: Up to Date, politely declines Pneumonia vaccine   Elevated LFT: -AST had been elevated to 41, ALT normal, Na 132 on CMP 3/23.  On recheck labs from 05/11/2021 liver enzymes returned back to normal, AST 23, ALT 14.  Total bilirubin slightly elevated at 1.3 and sodium slightly low at 133 -MCV elevated as well on CBC to 101.1, recheck at 100.7.  Vitamin B12, folate and thiamine all within normal limits. -Had been drinking multiple beers a day most days but has stopped drinking alcohol completely since our last visit, continues to maintain this.  Health Maintenance: -Blood work UTD -Colon cancer screening due, awaiting cologuard  Family History  Problem Relation Age of Onset   COPD Father    Cancer Father        bone    Social History   Socioeconomic History   Marital status: Single    Spouse name: Not on file   Number of children: Not on file   Years of education: Not on file   Highest education level: Not on file  Occupational History   Not on file  Tobacco Use   Smoking status: Former     Packs/day: 0.50    Years: 15.00    Total pack years: 7.50    Types: Cigarettes    Quit date: 2013    Years since quitting: 10.8   Smokeless tobacco: Never  Vaping Use   Vaping Use: Never used  Substance and Sexual Activity   Alcohol use: Never   Drug use: Never   Sexual activity: Not on file  Other Topics Concern   Not on file  Social History Narrative   Not on file   Social Determinants of Health   Financial Resource Strain: Low Risk  (04/13/2021)   Overall Financial Resource Strain (CARDIA)    Difficulty of Paying Living Expenses: Not hard at all  Food Insecurity: No Food Insecurity (04/13/2021)   Hunger Vital Sign    Worried About Running Out of Food in the Last Year: Never true    [place] in the Last Year: Never true  Transportation Needs: No Transportation Needs (04/13/2021)   PRAPARE - Hydrologist (Medical): No    Lack of Transportation (Non-Medical): No  Physical Activity: Inactive (04/13/2021)   Exercise Vital Sign    Days of Exercise per Week: 0 days    Minutes of Exercise per Session: 0 min  Stress: No Stress Concern Present (04/13/2021)   [place]    Feeling of Stress : Not at all  Social  Connections: Socially Isolated (04/13/2021)   Social Connection and Isolation Panel [NHANES]    Frequency of Communication with Friends and Family: Once a week    Frequency of Social Gatherings with Friends and Family: Once a week    Attends Religious Services: Never    Marine scientist or Organizations: No    Attends Archivist Meetings: Never    Marital Status: Never married  Intimate Partner Violence: Not At Risk (04/13/2021)   Humiliation, Afraid, Rape, and Kick questionnaire    Fear of Current or Ex-Partner: No    Emotionally Abused: No    Physically Abused: No    Sexually Abused: No    Outpatient Medications Prior to Visit  Medication Sig Dispense Refill    albuterol (VENTOLIN HFA) 108 (90 Base) MCG/ACT inhaler Inhale 2 puffs into the lungs every 6 (six) hours as needed for wheezing or shortness of breath. 8 g 6   amLODipine (NORVASC) 10 MG tablet TAKE 1 TABLET(10 MG) BY MOUTH DAILY 90 tablet 0   chlorthalidone (HYGROTON) 25 MG tablet TAKE 1 TABLET(25 MG) BY MOUTH DAILY 90 tablet 0   fluticasone (FLONASE) 50 MCG/ACT nasal spray Place 2 sprays into both nostrils daily. 16 g 6   Fluticasone-Umeclidin-Vilant (TRELEGY ELLIPTA) 200-62.5-25 MCG/ACT AEPB Inhale 1 puff into the lungs daily. 60 each 0   Fluticasone-Umeclidin-Vilant (TRELEGY ELLIPTA) 200-62.5-25 MCG/ACT AEPB Inhale 1 puff into the lungs daily. 60 each 11   Nebivolol HCl 20 MG TABS Take 1 tablet (20 mg total) by mouth 2 (two) times daily. 90 tablet 1   No facility-administered medications prior to visit.    Allergies  Allergen Reactions   Ace Inhibitors Swelling    Review of Systems  Constitutional:  Negative for chills and fever.  Eyes:  Negative for visual disturbance.  Respiratory:  Negative for cough, shortness of breath and wheezing.   Cardiovascular:  Negative for chest pain, palpitations and leg swelling.  Gastrointestinal:  Negative for abdominal pain.  Neurological:  Negative for dizziness and headaches.       Objective:    Physical Exam Constitutional:      Appearance: Normal appearance.  HENT:     Head: Normocephalic and atraumatic.  Eyes:     Conjunctiva/sclera: Conjunctivae normal.  Cardiovascular:     Rate and Rhythm: Normal rate and regular rhythm.  Pulmonary:     Effort: Pulmonary effort is normal.     Breath sounds: Normal breath sounds.  Skin:    General: Skin is warm and dry.     Comments:    Neurological:     General: No focal deficit present.     Mental Status: He is alert. Mental status is at baseline.  Psychiatric:        Mood and Affect: Mood normal.        Behavior: Behavior normal.     There were no vitals taken for this visit. Wt  Readings from Last 3 Encounters:  11/07/21 149 lb 8 oz (67.8 kg)  09/07/21 156 lb 14.4 oz (71.2 kg)  07/13/21 152 lb 4.8 oz (69.1 kg)   There were no vitals filed for this visit.    Health Maintenance Due  Topic Date Due   Zoster Vaccines- Shingrix (1 of 2) Never done     There are no preventive care reminders to display for this patient.   No results found for: "TSH" Lab Results  Component Value Date   WBC 11.7 (H) 05/11/2021   HGB  14.2 05/11/2021   HCT 41.1 05/11/2021   MCV 100.7 (H) 05/11/2021   PLT 192 05/11/2021   Lab Results  Component Value Date   NA 133 (L) 05/11/2021   K 4.0 05/11/2021   CO2 29 05/11/2021   GLUCOSE 94 05/11/2021   BUN 11 05/11/2021   CREATININE 0.85 05/11/2021   BILITOT 1.3 (H) 05/11/2021   AST 23 05/11/2021   ALT 14 05/11/2021   PROT 7.3 05/11/2021   CALCIUM 9.6 05/11/2021   EGFR 99 05/11/2021   Lab Results  Component Value Date   CHOL 194 04/13/2021   Lab Results  Component Value Date   HDL 114 04/13/2021   Lab Results  Component Value Date   LDLCALC 66 04/13/2021   Lab Results  Component Value Date   TRIG 55 04/13/2021   Lab Results  Component Value Date   CHOLHDL 1.7 04/13/2021   No results found for: "HGBA1C"     Assessment & Plan:   1. Hypertension, unspecified type: Blood pressure overall better but still uncontrolled, continue Amlodipine 10 mg, Chlorothiazide 25 mg and increase Bystolic to 20 mg BID. Continue to monitor at home, patient monitoring diet.   - Nebivolol HCl 20 MG TABS; Take 1 tablet (20 mg total) by mouth 2 (two) times daily.  Dispense: 90 tablet; Refill: 1  2. Chronic obstructive pulmonary disease, unspecified COPD type ([place]): Stable, doing well with Trelegy but has a hard time affording it, given 2 samples today.   Teodora Medici, DO

## 2021-12-08 ENCOUNTER — Ambulatory Visit (LOCAL_COMMUNITY_HEALTH_CENTER): Payer: Medicaid Other

## 2021-12-08 DIAGNOSIS — Z23 Encounter for immunization: Secondary | ICD-10-CM

## 2021-12-08 DIAGNOSIS — Z719 Counseling, unspecified: Secondary | ICD-10-CM

## 2021-12-08 NOTE — Progress Notes (Signed)
  Are you feeling sick today? No   Have you ever received a dose of COVID-19 Vaccine? [employer], [employer], [place], [place], Other) Yes  If yes, which vaccine and how many doses?   [place], 1 PFIZER   Did you bring the vaccination record card or other documentation?  Yes   Do you have a health condition or are undergoing treatment that makes you moderately or severely immunocompromised? This would include, but not be limited to: cancer, HIV, organ transplant, immunosuppressive therapy/high-dose corticosteroids, or moderate/severe primary immunodeficiency.  No  Have you received COVID-19 vaccine before or during hematopoietic cell transplant (HCT) or CAR-T-cell therapies? No  Have you ever had an allergic reaction to: (This would include a severe allergic reaction or a reaction that caused hives, swelling, or respiratory distress, including wheezing.) A component of a COVID-19 vaccine or a previous dose of COVID-19 vaccine? Yes, Ace Inhibitors-caused Swelling   Have you ever had an allergic reaction to another vaccine (other thanCOVID-19 vaccine) or an injectable medication? (This would include a severe allergic reaction or a reaction that caused hives, swelling, or respiratory distress, including wheezing.)   No    Do you have a history of any of the following:  Myocarditis or Pericarditis No  Dermal fillers:  No  Multisystem Inflammatory Syndrome (MIS-C or MIS-A)? No  COVID-19 disease within the past 3 months? No  Vaccinated with monkeypox vaccine in the last 4 weeks? No  Eligible for Pfizer, administered vaccine, monitored, tolerated well. M.Lexus Barletta, LPN.

## 2021-12-16 ENCOUNTER — Telehealth: Payer: Self-pay | Admitting: Internal Medicine

## 2021-12-16 DIAGNOSIS — I10 Essential (primary) hypertension: Secondary | ICD-10-CM

## 2021-12-18 ENCOUNTER — Encounter: Payer: Self-pay | Admitting: Internal Medicine

## 2021-12-18 ENCOUNTER — Ambulatory Visit: Payer: Self-pay | Admitting: Internal Medicine

## 2021-12-18 VITALS — BP 132/72 | HR 75 | Temp 98.4°F | Resp 18 | Ht 71.0 in | Wt 151.1 lb

## 2021-12-18 DIAGNOSIS — J441 Chronic obstructive pulmonary disease with (acute) exacerbation: Secondary | ICD-10-CM

## 2021-12-18 DIAGNOSIS — R052 Subacute cough: Secondary | ICD-10-CM

## 2021-12-18 DIAGNOSIS — I10 Essential (primary) hypertension: Secondary | ICD-10-CM

## 2021-12-18 MED ORDER — CHLORTHALIDONE 25 MG PO TABS: ORAL_TABLET | ORAL | 1 refills | Status: DC

## 2021-12-18 MED ORDER — BENZONATATE 100 MG PO CAPS: 100.0000 mg | ORAL_CAPSULE | Freq: Two times a day (BID) | ORAL | 0 refills | Status: DC | PRN

## 2021-12-18 MED ORDER — PREDNISONE 20 MG PO TABS: 20.0000 mg | ORAL_TABLET | Freq: Every day | ORAL | 0 refills | Status: AC

## 2021-12-18 MED ORDER — ADULT BLOOD PRESSURE CUFF LG KIT: 1.0000 | PACK | Freq: Every day | 0 refills | Status: DC

## 2021-12-18 NOTE — Patient Instructions (Signed)
It was great seeing you today!  Plan discussed at today's visit: -Prednisone sent to pharmacy to help open airway and to help with cough. This is steroid. I also sent tessalon perles to help with cough -Continue inhalers -Continue blood pressure medications as you've been doing -Blood pressure cuff sent to pharmacy as well   Follow up in: 3 months   Take care and let us know if you have any questions or concerns prior to your next visit.  Dr. Caralee Ates

## 2021-12-18 NOTE — Progress Notes (Signed)
Established Office Visit  Subjective:    Patient ID: Dakota Blackwell, male    DOB: Sep 21, 1960, 61 y.o.   MRN: [id number]  Chief Complaint  Patient presents with   Follow-up   Hypertension    HPI Patient is in today for follow up on blood pressure. He has been coughing as well for the last 2 weeks.   URI Compliant:  -Fever: no -Cough: yes productive with green mucus  -Shortness of breath: no -Wheezing: no -Chest tightness: yes -Chest congestion: no -Nasal congestion: yes -Runny nose: no -Post nasal drip: no -Sore throat: no -Sinus pressure: no -Headache: no -Ear pain: no  -Ear pressure: no  -Context: stable -Relief with OTC cold/cough medications: no  -Treatments attempted: Nyquil, Robitussin    Hypertension: -Medications: Amlodipine 10 mg, Chlorothiazide 25 mg, Bystolic increased to 20 mg BID at LOV Medications Failed: ACEI caused facial swelling, HCTZ caused rash -Checking BP at home (average): not checking, does not have a working cuff -Denies any SOB, CP, vision changes, LE edema or symptoms of hypotension. New medication without side effects  -Diet: working on cutting down in sodium and has completely cut out alcohol  -Exercise: no routine but likes to walk  Severe COPD: -Following with Pulmonology, note reviewed from 07/10/21 -COPD status: exacerbated -Current Treatments: Trelegy, Albuterol PRN -Satisfied with current treatment?: yes -Influenza: Up to Date, politely declines Pneumonia vaccine   Elevated LFT: -AST had been elevated to 41, ALT normal, Na 132 on CMP 3/23.  On recheck labs from 05/11/2021 liver enzymes returned back to normal, AST 23, ALT 14.  Total bilirubin slightly elevated at 1.3 and sodium slightly low at 133 -MCV elevated as well on CBC to 101.1, recheck at 100.7.  Vitamin B12, folate and thiamine all within normal limits. -Had been drinking multiple beers a day most days but has stopped drinking alcohol completely since our last visit,  continues to maintain this.  Health Maintenance: -Blood work UTD -Colon cancer screening due, awaiting cologuard  Family History  Problem Relation Age of Onset   COPD Father    Cancer Father        bone    Social History   Socioeconomic History   Marital status: Single    Spouse name: Not on file   Number of children: Not on file   Years of education: Not on file   Highest education level: Not on file  Occupational History   Not on file  Tobacco Use   Smoking status: Former    Packs/day: 0.50    Years: 15.00    Total pack years: 7.50    Types: Cigarettes    Quit date: 2013    Years since quitting: 10.8   Smokeless tobacco: Never  Vaping Use   Vaping Use: Never used  Substance and Sexual Activity   Alcohol use: Never   Drug use: Never   Sexual activity: Not on file  Other Topics Concern   Not on file  Social History Narrative   Not on file   Social Determinants of Health   Financial Resource Strain: Low Risk  (04/13/2021)   Overall Financial Resource Strain (CARDIA)    Difficulty of Paying Living Expenses: Not hard at all  Food Insecurity: No Food Insecurity (04/13/2021)   Hunger Vital Sign    Worried About Running Out of Food in the Last Year: Never true    Ran Out of Food in the Last Year: Never true  Transportation Needs: No Transportation Needs (  04/13/2021)   PRAPARE - Hydrologist (Medical): No    Lack of Transportation (Non-Medical): No  Physical Activity: Inactive (04/13/2021)   Exercise Vital Sign    Days of Exercise per Week: 0 days    Minutes of Exercise per Session: 0 min  Stress: No Stress Concern Present (04/13/2021)   [place]    Feeling of Stress : Not at all  Social Connections: Socially Isolated (04/13/2021)   Social Connection and Isolation Panel [NHANES]    Frequency of Communication with Friends and Family: Once a week    Frequency of Social  Gatherings with Friends and Family: Once a week    Attends Religious Services: Never    Marine scientist or Organizations: No    Attends Archivist Meetings: Never    Marital Status: Never married  Intimate Partner Violence: Not At Risk (04/13/2021)   Humiliation, Afraid, Rape, and Kick questionnaire    Fear of Current or Ex-Partner: No    Emotionally Abused: No    Physically Abused: No    Sexually Abused: No    Outpatient Medications Prior to Visit  Medication Sig Dispense Refill   albuterol (VENTOLIN HFA) 108 (90 Base) MCG/ACT inhaler Inhale 2 puffs into the lungs every 6 (six) hours as needed for wheezing or shortness of breath. 8 g 6   amLODipine (NORVASC) 10 MG tablet TAKE 1 TABLET(10 MG) BY MOUTH DAILY 90 tablet 0   fluticasone (FLONASE) 50 MCG/ACT nasal spray Place 2 sprays into both nostrils daily. 16 g 6   Fluticasone-Umeclidin-Vilant (TRELEGY ELLIPTA) 200-62.5-25 MCG/ACT AEPB Inhale 1 puff into the lungs daily. 60 each 0   Fluticasone-Umeclidin-Vilant (TRELEGY ELLIPTA) 200-62.5-25 MCG/ACT AEPB Inhale 1 puff into the lungs daily. 60 each 11   Nebivolol HCl 20 MG TABS Take 1 tablet (20 mg total) by mouth 2 (two) times daily. 90 tablet 1   chlorthalidone (HYGROTON) 25 MG tablet TAKE 1 TABLET(25 MG) BY MOUTH DAILY 90 tablet 0   No facility-administered medications prior to visit.    Allergies  Allergen Reactions   Ace Inhibitors Swelling    Review of Systems  Constitutional:  Negative for chills and fever.  HENT:  Positive for congestion. Negative for sinus pain.   Eyes:  Negative for visual disturbance.  Respiratory:  Positive for cough. Negative for shortness of breath and wheezing.   Cardiovascular:  Negative for chest pain, palpitations and leg swelling.  Gastrointestinal:  Negative for abdominal pain.  Neurological:  Negative for dizziness and headaches.       Objective:    Physical Exam Constitutional:      Appearance: Normal appearance.  HENT:      Head: Normocephalic and atraumatic.     Mouth/Throat:     Mouth: Mucous membranes are moist.     Pharynx: Oropharynx is clear.  Eyes:     Conjunctiva/sclera: Conjunctivae normal.  Cardiovascular:     Rate and Rhythm: Normal rate and regular rhythm.  Pulmonary:     Effort: Pulmonary effort is normal.     Breath sounds: No wheezing, rhonchi or rales.     Comments: Decreased air movement throughout  Skin:    General: Skin is warm and dry.     Comments:    Neurological:     General: No focal deficit present.     Mental Status: He is alert. Mental status is at baseline.  Psychiatric:  Mood and Affect: Mood normal.        Behavior: Behavior normal.     BP 132/72   Pulse 75   Temp 98.4 F (36.9 C)   Resp 18   Ht 5' 11" (1.803 m)   Wt 151 lb 1.6 oz (68.5 kg)   SpO2 95%   BMI 21.07 kg/m  Wt Readings from Last 3 Encounters:  12/18/21 151 lb 1.6 oz (68.5 kg)  11/07/21 149 lb 8 oz (67.8 kg)  09/07/21 156 lb 14.4 oz (71.2 kg)   Vitals:   12/18/21 1009  BP: 132/72     There are no preventive care reminders to display for this patient.    There are no preventive care reminders to display for this patient.   No results found for: "TSH" Lab Results  Component Value Date   WBC 11.7 (H) 05/11/2021   HGB 14.2 05/11/2021   HCT 41.1 05/11/2021   MCV 100.7 (H) 05/11/2021   PLT 192 05/11/2021   Lab Results  Component Value Date   NA 133 (L) 05/11/2021   K 4.0 05/11/2021   CO2 29 05/11/2021   GLUCOSE 94 05/11/2021   BUN 11 05/11/2021   CREATININE 0.85 05/11/2021   BILITOT 1.3 (H) 05/11/2021   AST 23 05/11/2021   ALT 14 05/11/2021   PROT 7.3 05/11/2021   CALCIUM 9.6 05/11/2021   EGFR 99 05/11/2021   Lab Results  Component Value Date   CHOL 194 04/13/2021   Lab Results  Component Value Date   HDL 114 04/13/2021   Lab Results  Component Value Date   LDLCALC 66 04/13/2021   Lab Results  Component Value Date   TRIG 55 04/13/2021   Lab Results   Component Value Date   CHOLHDL 1.7 04/13/2021   No results found for: "HGBA1C"     Assessment & Plan:   1. Hypertension, unspecified type: Blood pressure much better controlled today. Continue current regimen, which includes  Amlodipine 10 mg, Chlorothiazide 25 mg, Bystolic 40 mg daily. Refill chlorothiazide today. Blood pressure cuff kit sent to pharmacy so he can monitor at home. Follow up in 3 months.   - Blood Pressure Monitoring (ADULT BLOOD PRESSURE CUFF LG) KIT; 1 each by Does not apply route daily.  Dispense: 1 kit; Refill: 0 - chlorthalidone (HYGROTON) 25 MG tablet; TAKE 1 TABLET(25 MG) BY MOUTH DAILY  Dispense: 90 tablet; Refill: 1  2. COPD exacerbation (HCC)/Subacute cough: Not  moving air well on exam with productive cough, treat with Prednisone 20 mg x 5 days and cough suppressant as well. Continue Trelegy, samples provided.   - predniSONE (DELTASONE) 20 MG tablet; Take 1 tablet (20 mg total) by mouth daily with breakfast for 5 days.  Dispense: 5 tablet; Refill: 0 - benzonatate (TESSALON) 100 MG capsule; Take 1 capsule (100 mg total) by mouth 2 (two) times daily as needed for cough.  Dispense: 20 capsule; Refill: 0  Teodora Medici, DO

## 2021-12-18 NOTE — Telephone Encounter (Signed)
Refilled 11/07/2021 #90 1 rf - same pharm. Requested Prescriptions  Pending Prescriptions Disp Refills   Nebivolol HCl 20 MG TABS [Pharmacy Med Name: NEBIVOLOL 20MG  TABLETS] 90 tablet 1    Sig: TAKE 1 TABLET(20 MG) BY MOUTH DAILY     Cardiovascular: Beta Blockers 3 Failed - 12/16/2021  8:49 AM      Failed - Last BP in normal range    BP Readings from Last 1 Encounters:  11/07/21 (!) 145/70         Passed - Cr in normal range and within 360 days    Creat  Date Value Ref Range Status  05/11/2021 0.85 0.70 - 1.35 mg/dL Final         Passed - AST in normal range and within 360 days    AST  Date Value Ref Range Status  05/11/2021 23 10 - 35 U/L Final         Passed - ALT in normal range and within 360 days    ALT  Date Value Ref Range Status  05/11/2021 14 9 - 46 U/L Final         Passed - Last Heart Rate in normal range    Pulse Readings from Last 1 Encounters:  11/07/21 74         Passed - Valid encounter within last 6 months    Recent Outpatient Visits           1 month ago Hypertension, unspecified type   [facility], DO   3 months ago Hypertension, unspecified type   [facility], DO   5 months ago Hypertension, unspecified type   [facility], DO   6 months ago Hypertension, unspecified type   [facility], DO   7 months ago Hypertension, unspecified type   [facility], DO       Future Appointments             Today Margarita Mail, DO [facility], [facility]

## 2022-02-03 ENCOUNTER — Other Ambulatory Visit: Payer: Self-pay | Admitting: Internal Medicine

## 2022-02-03 DIAGNOSIS — I10 Essential (primary) hypertension: Secondary | ICD-10-CM

## 2022-02-04 ENCOUNTER — Telehealth: Payer: Self-pay | Admitting: Internal Medicine

## 2022-02-04 DIAGNOSIS — I10 Essential (primary) hypertension: Secondary | ICD-10-CM

## 2022-02-04 NOTE — Telephone Encounter (Signed)
Requested Prescriptions  Pending Prescriptions Disp Refills   amLODipine (NORVASC) 10 MG tablet [Pharmacy Med Name: AMLODIPINE BESYLATE 10MG  TABLETS] 90 tablet 0    Sig: TAKE 1 TABLET(10 MG) BY MOUTH DAILY     Cardiovascular: Calcium Channel Blockers 2 Passed - 02/03/2022  2:50 PM      Passed - Last BP in normal range    BP Readings from Last 1 Encounters:  12/18/21 132/72         Passed - Last Heart Rate in normal range    Pulse Readings from Last 1 Encounters:  12/18/21 75         Passed - Valid encounter within last 6 months    Recent Outpatient Visits           1 month ago Hypertension, unspecified type   [facility], DO   2 months ago Hypertension, unspecified type   [facility], DO   5 months ago Hypertension, unspecified type   [facility], DO   6 months ago Hypertension, unspecified type   [facility], DO   7 months ago Hypertension, unspecified type   [facility], DO       Future Appointments             In 1 month Margarita Mail, DO [facility], [facility]

## 2022-02-06 NOTE — Telephone Encounter (Signed)
Last RF 12/18/21 #90 1 RF  Requested Prescriptions  Refused Prescriptions Disp Refills   chlorthalidone (HYGROTON) 25 MG tablet [Pharmacy Med Name: CHLORTHALIDONE 25MG  TABLETS] 90 tablet 1    Sig: TAKE 1 TABLET(25 MG) BY MOUTH DAILY     Cardiovascular: Diuretics - Thiazide Failed - 02/04/2022  9:41 AM      Failed - Cr in normal range and within 180 days    Creat  Date Value Ref Range Status  05/11/2021 0.85 0.70 - 1.35 mg/dL Final         Failed - K in normal range and within 180 days    Potassium  Date Value Ref Range Status  05/11/2021 4.0 3.5 - 5.3 mmol/L Final         Failed - Na in normal range and within 180 days    Sodium  Date Value Ref Range Status  05/11/2021 133 (L) 135 - 146 mmol/L Final         Passed - Last BP in normal range    BP Readings from Last 1 Encounters:  12/18/21 132/72         Passed - Valid encounter within last 6 months    Recent Outpatient Visits           1 month ago Hypertension, unspecified type   [place], DO   3 months ago Hypertension, unspecified type   [place], DO   5 months ago Hypertension, unspecified type   [place], DO   6 months ago Hypertension, unspecified type   [place], DO   7 months ago Hypertension, unspecified type   [facility] Teodora Medici, DO       Future Appointments             In 1 month Teodora Medici, [facility], [facility]

## 2022-02-27 ENCOUNTER — Other Ambulatory Visit: Payer: Self-pay | Admitting: Internal Medicine

## 2022-02-27 DIAGNOSIS — I10 Essential (primary) hypertension: Secondary | ICD-10-CM

## 2022-02-27 NOTE — Telephone Encounter (Signed)
Requested Prescriptions  Pending Prescriptions Disp Refills   Nebivolol HCl 20 MG TABS [Pharmacy Med Name: NEBIVOLOL 20MG  TABLETS] 180 tablet 0    Sig: TAKE 1 TABLET(20 MG) BY MOUTH TWICE DAILY     Cardiovascular: Beta Blockers 3 Passed - 02/27/2022 10:31 AM      Passed - Cr in normal range and within 360 days    Creat  Date Value Ref Range Status  05/11/2021 0.85 0.70 - 1.35 mg/dL Final         Passed - AST in normal range and within 360 days    AST  Date Value Ref Range Status  05/11/2021 23 10 - 35 U/L Final         Passed - ALT in normal range and within 360 days    ALT  Date Value Ref Range Status  05/11/2021 14 9 - 46 U/L Final         Passed - Last BP in normal range    BP Readings from Last 1 Encounters:  12/18/21 132/72         Passed - Last Heart Rate in normal range    Pulse Readings from Last 1 Encounters:  12/18/21 75         Passed - Valid encounter within last 6 months    Recent Outpatient Visits           2 months ago Hypertension, unspecified type   [place], DO   3 months ago Hypertension, unspecified type   [place], DO   5 months ago Hypertension, unspecified type   [place], DO   7 months ago Hypertension, unspecified type   [place], DO   8 months ago Hypertension, unspecified type   [facility] Teodora Medici, DO       Future Appointments             In 3 weeks Teodora Medici, [facility], [facility]

## 2022-02-28 ENCOUNTER — Other Ambulatory Visit: Payer: Self-pay | Admitting: Internal Medicine

## 2022-02-28 DIAGNOSIS — I10 Essential (primary) hypertension: Secondary | ICD-10-CM

## 2022-02-28 NOTE — Telephone Encounter (Signed)
Called pharmacy to clarify status of refill. Pharmacy tech reports patient has picked up medication today .

## 2022-02-28 NOTE — Telephone Encounter (Signed)
Requested by interface surescripts. Called pharmacy and patient already picked up Rx. Signed for 02/27/22. Requested Prescriptions  Refused Prescriptions Disp Refills   Nebivolol HCl 20 MG TABS [Pharmacy Med Name: NEBIVOLOL 20MG  TABLETS] 180 tablet 0    Sig: TAKE 1 TABLET BY MOUTH TWICE DAILY     Cardiovascular: Beta Blockers 3 Passed - 02/28/2022 11:33 AM      Passed - Cr in normal range and within 360 days    Creat  Date Value Ref Range Status  05/11/2021 0.85 0.70 - 1.35 mg/dL Final         Passed - AST in normal range and within 360 days    AST  Date Value Ref Range Status  05/11/2021 23 10 - 35 U/L Final         Passed - ALT in normal range and within 360 days    ALT  Date Value Ref Range Status  05/11/2021 14 9 - 46 U/L Final         Passed - Last BP in normal range    BP Readings from Last 1 Encounters:  12/18/21 132/72         Passed - Last Heart Rate in normal range    Pulse Readings from Last 1 Encounters:  12/18/21 75         Passed - Valid encounter within last 6 months    Recent Outpatient Visits           2 months ago Hypertension, unspecified type   [place], DO   3 months ago Hypertension, unspecified type   [place], DO   5 months ago Hypertension, unspecified type   [place], DO   7 months ago Hypertension, unspecified type   [place], DO   8 months ago Hypertension, unspecified type   [facility] Teodora Medici, DO       Future Appointments             In 3 weeks Teodora Medici, [facility], [facility]

## 2022-03-08 ENCOUNTER — Other Ambulatory Visit (HOSPITAL_COMMUNITY): Payer: Self-pay

## 2022-03-20 NOTE — Progress Notes (Unsigned)
Established Office Visit  Subjective:    Patient ID: Dakota Blackwell, male    DOB: 12-14-1960, 62 y.o.   MRN: [id number]  No chief complaint on file.   HPI Patient is in today for follow up on blood pressure.   Hypertension: -Medications: Amlodipine 10 mg, Chlorothiazide 25 mg, Bystolic 20 mg BID  Medications Failed: ACEI caused facial swelling, HCTZ caused rash -Checking BP at home (average): not checking, does not have a working cuff -Denies any SOB, CP, vision changes, LE edema or symptoms of hypotension. New medication without side effects  -Diet: working on cutting down in sodium and has completely cut out alcohol  -Exercise: no routine but likes to walk  Severe COPD: -Following with Pulmonology, note reviewed from 07/10/21 -COPD status: exacerbated -Current Treatments: Trelegy, Albuterol PRN -Satisfied with current treatment?: yes -Influenza: Up to Date, politely declines Pneumonia vaccine   Elevated LFT: -AST had been elevated to 41, ALT normal, Na 132 on CMP 3/23.  On recheck labs from 05/11/2021 liver enzymes returned back to normal, AST 23, ALT 14.  Total bilirubin slightly elevated at 1.3 and sodium slightly low at 133 -MCV elevated as well on CBC to 101.1, recheck at 100.7.  Vitamin B12, folate and thiamine all within normal limits. -Had been drinking multiple beers a day most days but has stopped drinking alcohol completely since our last visit, continues to maintain this.  Health Maintenance: -Blood work UTD -Colon cancer screening due, awaiting cologuard  Family History  Problem Relation Age of Onset   COPD Father    Cancer Father        bone    Social History   Socioeconomic History   Marital status: Single    Spouse name: Not on file   Number of children: Not on file   Years of education: Not on file   Highest education level: Not on file  Occupational History   Not on file  Tobacco Use   Smoking status: Former    Packs/day: 0.50    Years:  15.00    Total pack years: 7.50    Types: Cigarettes    Quit date: 2013    Years since quitting: 11.1   Smokeless tobacco: Never  Vaping Use   Vaping Use: Never used  Substance and Sexual Activity   Alcohol use: Never   Drug use: Never   Sexual activity: Not on file  Other Topics Concern   Not on file  Social History Narrative   Not on file   Social Determinants of Health   Financial Resource Strain: Low Risk  (04/13/2021)   Overall Financial Resource Strain (CARDIA)    Difficulty of Paying Living Expenses: Not hard at all  Food Insecurity: No Food Insecurity (04/13/2021)   Hunger Vital Sign    Worried About Running Out of Food in the Last Year: Never true    [place] in the Last Year: Never true  Transportation Needs: No Transportation Needs (04/13/2021)   PRAPARE - Hydrologist (Medical): No    Lack of Transportation (Non-Medical): No  Physical Activity: Inactive (04/13/2021)   Exercise Vital Sign    Days of Exercise per Week: 0 days    Minutes of Exercise per Session: 0 min  Stress: No Stress Concern Present (04/13/2021)   [place]    Feeling of Stress : Not at all  Social Connections: Socially Isolated (04/13/2021)  Social Licensed conveyancer [NHANES]    Frequency of Communication with Friends and Family: Once a week    Frequency of Social Gatherings with Friends and Family: Once a week    Attends Religious Services: Never    Marine scientist or Organizations: No    Attends Archivist Meetings: Never    Marital Status: Never married  Intimate Partner Violence: Not At Risk (04/13/2021)   Humiliation, Afraid, Rape, and Kick questionnaire    Fear of Current or Ex-Partner: No    Emotionally Abused: No    Physically Abused: No    Sexually Abused: No    Outpatient Medications Prior to Visit  Medication Sig Dispense Refill   albuterol (VENTOLIN HFA)  108 (90 Base) MCG/ACT inhaler Inhale 2 puffs into the lungs every 6 (six) hours as needed for wheezing or shortness of breath. 8 g 6   amLODipine (NORVASC) 10 MG tablet TAKE 1 TABLET(10 MG) BY MOUTH DAILY 90 tablet 0   benzonatate (TESSALON) 100 MG capsule Take 1 capsule (100 mg total) by mouth 2 (two) times daily as needed for cough. 20 capsule 0   Blood Pressure Monitoring (ADULT BLOOD PRESSURE CUFF LG) KIT 1 each by Does not apply route daily. 1 kit 0   chlorthalidone (HYGROTON) 25 MG tablet TAKE 1 TABLET(25 MG) BY MOUTH DAILY 90 tablet 1   fluticasone (FLONASE) 50 MCG/ACT nasal spray Place 2 sprays into both nostrils daily. 16 g 6   Fluticasone-Umeclidin-Vilant (TRELEGY ELLIPTA) 200-62.5-25 MCG/ACT AEPB Inhale 1 puff into the lungs daily. 60 each 0   Fluticasone-Umeclidin-Vilant (TRELEGY ELLIPTA) 200-62.5-25 MCG/ACT AEPB Inhale 1 puff into the lungs daily. 60 each 11   Nebivolol HCl 20 MG TABS TAKE 1 TABLET(20 MG) BY MOUTH TWICE DAILY 180 tablet 0   No facility-administered medications prior to visit.    Allergies  Allergen Reactions   Ace Inhibitors Swelling    Review of Systems  Constitutional:  Negative for chills and fever.  HENT:  Positive for congestion. Negative for sinus pain.   Eyes:  Negative for visual disturbance.  Respiratory:  Positive for cough. Negative for shortness of breath and wheezing.   Cardiovascular:  Negative for chest pain, palpitations and leg swelling.  Gastrointestinal:  Negative for abdominal pain.  Neurological:  Negative for dizziness and headaches.       Objective:    Physical Exam Constitutional:      Appearance: Normal appearance.  HENT:     Head: Normocephalic and atraumatic.     Mouth/Throat:     Mouth: Mucous membranes are moist.     Pharynx: Oropharynx is clear.  Eyes:     Conjunctiva/sclera: Conjunctivae normal.  Cardiovascular:     Rate and Rhythm: Normal rate and regular rhythm.  Pulmonary:     Effort: Pulmonary effort is  normal.     Breath sounds: No wheezing, rhonchi or rales.     Comments: Decreased air movement throughout  Skin:    General: Skin is warm and dry.     Comments:    Neurological:     General: No focal deficit present.     Mental Status: He is alert. Mental status is at baseline.  Psychiatric:        Mood and Affect: Mood normal.        Behavior: Behavior normal.     There were no vitals taken for this visit. Wt Readings from Last 3 Encounters:  12/18/21 151 lb 1.6 oz (68.5  kg)  11/07/21 149 lb 8 oz (67.8 kg)  09/07/21 156 lb 14.4 oz (71.2 kg)   There were no vitals filed for this visit.    Health Maintenance Due  Topic Date Due   DTaP/Tdap/Td (1 - Tdap) Never done   COLONOSCOPY (Pts 45-47yr Insurance coverage will need to be confirmed)  Never done      There are no preventive care reminders to display for this patient.   No results found for: "TSH" Lab Results  Component Value Date   WBC 11.7 (H) 05/11/2021   HGB 14.2 05/11/2021   HCT 41.1 05/11/2021   MCV 100.7 (H) 05/11/2021   PLT 192 05/11/2021   Lab Results  Component Value Date   NA 133 (L) 05/11/2021   K 4.0 05/11/2021   CO2 29 05/11/2021   GLUCOSE 94 05/11/2021   BUN 11 05/11/2021   CREATININE 0.85 05/11/2021   BILITOT 1.3 (H) 05/11/2021   AST 23 05/11/2021   ALT 14 05/11/2021   PROT 7.3 05/11/2021   CALCIUM 9.6 05/11/2021   EGFR 99 05/11/2021   Lab Results  Component Value Date   CHOL 194 04/13/2021   Lab Results  Component Value Date   HDL 114 04/13/2021   Lab Results  Component Value Date   LDLCALC 66 04/13/2021   Lab Results  Component Value Date   TRIG 55 04/13/2021   Lab Results  Component Value Date   CHOLHDL 1.7 04/13/2021   No results found for: "HGBA1C"     Assessment & Plan:   1. Hypertension, unspecified type: Blood pressure much better controlled today. Continue current regimen, which includes  Amlodipine 10 mg, Chlorothiazide 25 mg, Bystolic 40 mg daily. Refill  chlorothiazide today. Blood pressure cuff kit sent to pharmacy so he can monitor at home. Follow up in 3 months.   - Blood Pressure Monitoring (ADULT BLOOD PRESSURE CUFF LG) KIT; 1 each by Does not apply route daily.  Dispense: 1 kit; Refill: 0 - chlorthalidone (HYGROTON) 25 MG tablet; TAKE 1 TABLET(25 MG) BY MOUTH DAILY  Dispense: 90 tablet; Refill: 1  2. COPD exacerbation (HCC)/Subacute cough: Not  moving air well on exam with productive cough, treat with Prednisone 20 mg x 5 days and cough suppressant as well. Continue Trelegy, samples provided.   - predniSONE (DELTASONE) 20 MG tablet; Take 1 tablet (20 mg total) by mouth daily with breakfast for 5 days.  Dispense: 5 tablet; Refill: 0 - benzonatate (TESSALON) 100 MG capsule; Take 1 capsule (100 mg total) by mouth 2 (two) times daily as needed for cough.  Dispense: 20 capsule; Refill: 0  ETeodora Medici DO

## 2022-03-21 ENCOUNTER — Encounter: Payer: Self-pay | Admitting: Internal Medicine

## 2022-03-21 ENCOUNTER — Ambulatory Visit: Payer: Self-pay | Admitting: Internal Medicine

## 2022-03-21 VITALS — BP 150/80 | HR 85 | Temp 98.2°F | Resp 16 | Ht 71.0 in | Wt 149.5 lb

## 2022-03-21 DIAGNOSIS — I1A0 Resistant hypertension: Secondary | ICD-10-CM

## 2022-03-21 DIAGNOSIS — Z1211 Encounter for screening for malignant neoplasm of colon: Secondary | ICD-10-CM

## 2022-03-21 DIAGNOSIS — J449 Chronic obstructive pulmonary disease, unspecified: Secondary | ICD-10-CM

## 2022-03-21 MED ORDER — CHLORTHALIDONE 25 MG PO TABS: ORAL_TABLET | ORAL | 1 refills | Status: DC

## 2022-03-21 MED ORDER — AMLODIPINE BESYLATE 10 MG PO TABS: 10.0000 mg | ORAL_TABLET | Freq: Every day | ORAL | 1 refills | Status: DC

## 2022-03-21 MED ORDER — NEBIVOLOL HCL 20 MG PO TABS: ORAL_TABLET | ORAL | 1 refills | Status: DC

## 2022-03-21 NOTE — Patient Instructions (Addendum)
It was great seeing you today!  Plan discussed at today's visit: -Blood pressure still uncontrolled, continue current medications and referral to heart doctor ordered today -Medications refilled -Cologuard ordered  Follow up in: 3 months   Take care and let Dakota Blackwell know if you have any questions or concerns prior to your next visit.  Dr. Rosana Berger  DASH Eating Plan DASH stands for Dietary Approaches to Stop Hypertension. The DASH eating plan is a healthy eating plan that has been shown to: Reduce high blood pressure (hypertension). Reduce your risk for type 2 diabetes, heart disease, and stroke. Help with weight loss. What are tips for following this plan? Reading food labels Check food labels for the amount of salt (sodium) per serving. Choose foods with less than 5 percent of the Daily Value of sodium. Generally, foods with less than 300 milligrams (mg) of sodium per serving fit into this eating plan. To find whole grains, look for the word "whole" as the first word in the ingredient list. Shopping Buy products labeled as "low-sodium" or "no salt added." Buy fresh foods. Avoid canned foods and pre-made or frozen meals. Cooking Avoid adding salt when cooking. Use salt-free seasonings or herbs instead of table salt or sea salt. Check with your health care provider or pharmacist before using salt substitutes. Do not fry foods. Cook foods using healthy methods such as baking, boiling, grilling, roasting, and broiling instead. Cook with heart-healthy oils, such as olive, canola, avocado, soybean, or sunflower oil. Meal planning  Eat a balanced diet that includes: 4 or more servings of fruits and 4 or more servings of vegetables each day. Try to fill one-half of your plate with fruits and vegetables. 6-8 servings of whole grains each day. Less than 6 oz (170 g) of lean meat, poultry, or fish each day. A 3-oz (85-g) serving of meat is about the same size as a deck of cards. One egg equals 1 oz  (28 g). 2-3 servings of low-fat dairy each day. One serving is 1 cup (237 mL). 1 serving of nuts, seeds, or beans 5 times each week. 2-3 servings of heart-healthy fats. Healthy fats called omega-3 fatty acids are found in foods such as walnuts, flaxseeds, fortified milks, and eggs. These fats are also found in cold-water fish, such as sardines, salmon, and mackerel. Limit how much you eat of: Canned or prepackaged foods. Food that is high in trans fat, such as some fried foods. Food that is high in saturated fat, such as fatty meat. Desserts and other sweets, sugary drinks, and other foods with added sugar. Full-fat dairy products. Do not salt foods before eating. Do not eat more than 4 egg yolks a week. Try to eat at least 2 vegetarian meals a week. Eat more home-cooked food and less restaurant, buffet, and fast food. Lifestyle When eating at a restaurant, ask that your food be prepared with less salt or no salt, if possible. If you drink alcohol: Limit how much you use to: 0-1 drink a day for women who are not pregnant. 0-2 drinks a day for men. Be aware of how much alcohol is in your drink. In the [place] one drink equals one 12 oz bottle of beer (355 mL), one 5 oz glass of wine (148 mL), or one 1 oz glass of hard liquor (44 mL). General information Avoid eating more than 2,300 mg of salt a day. If you have hypertension, you may need to reduce your sodium intake to 1,500 mg a day. Work  with your health care provider to maintain a healthy body weight or to lose weight. Ask what an ideal weight is for you. Get at least 30 minutes of exercise that causes your heart to beat faster (aerobic exercise) most days of the week. Activities may include walking, swimming, or biking. Work with your health care provider or dietitian to adjust your eating plan to your individual calorie needs. What foods should I eat? Fruits All fresh, dried, or frozen fruit. Canned fruit in natural juice (without  added sugar). Vegetables Fresh or frozen vegetables (raw, steamed, roasted, or grilled). Low-sodium or reduced-sodium tomato and vegetable juice. Low-sodium or reduced-sodium tomato sauce and tomato paste. Low-sodium or reduced-sodium canned vegetables. Grains Whole-grain or whole-wheat bread. Whole-grain or whole-wheat pasta. Brown rice. [place]. Bulgur. Whole-grain and low-sodium cereals. Pita bread. Low-fat, low-sodium crackers. Whole-wheat flour tortillas. Meats and other proteins Skinless chicken or [place]. Ground chicken or [place]. Pork with fat trimmed off. Fish and seafood. Egg whites. Dried beans, peas, or lentils. Unsalted nuts, nut butters, and seeds. Unsalted canned beans. Lean cuts of beef with fat trimmed off. Low-sodium, lean precooked or cured meat, such as sausages or meat loaves. Dairy Low-fat (1%) or fat-free (skim) milk. Reduced-fat, low-fat, or fat-free cheeses. Nonfat, low-sodium ricotta or cottage cheese. Low-fat or nonfat yogurt. Low-fat, low-sodium cheese. Fats and oils Soft margarine without trans fats. Vegetable oil. Reduced-fat, low-fat, or light mayonnaise and salad dressings (reduced-sodium). Canola, safflower, olive, avocado, soybean, and sunflower oils. Avocado. Seasonings and condiments Herbs. Spices. Seasoning mixes without salt. Other foods Unsalted popcorn and pretzels. Fat-free sweets. The items listed above may not be a complete list of foods and beverages you can eat. Contact a dietitian for more information. What foods should I avoid? Fruits Canned fruit in a light or heavy syrup. Fried fruit. Fruit in cream or butter sauce. Vegetables Creamed or fried vegetables. Vegetables in a cheese sauce. Regular canned vegetables (not low-sodium or reduced-sodium). Regular canned tomato sauce and paste (not low-sodium or reduced-sodium). Regular tomato and vegetable juice (not low-sodium or reduced-sodium). Angie Fava. Olives. Grains Baked goods made with fat,  such as croissants, muffins, or some breads. Dry pasta or rice meal packs. Meats and other proteins Fatty cuts of meat. Ribs. Fried meat. Berniece Salines. Bologna, salami, and other precooked or cured meats, such as sausages or meat loaves. Fat from the back of a pig (fatback). Bratwurst. Salted nuts and seeds. Canned beans with added salt. Canned or smoked fish. Whole eggs or egg yolks. Chicken or [place] with skin. Dairy Whole or 2% milk, cream, and half-and-half. Whole or full-fat cream cheese. Whole-fat or sweetened yogurt. Full-fat cheese. Nondairy creamers. Whipped toppings. Processed cheese and cheese spreads. Fats and oils Butter. Stick margarine. Lard. Shortening. Ghee. Bacon fat. Tropical oils, such as coconut, palm kernel, or palm oil. Seasonings and condiments Onion salt, garlic salt, seasoned salt, table salt, and sea salt. Worcestershire sauce. Tartar sauce. Barbecue sauce. Teriyaki sauce. Soy sauce, including reduced-sodium. Steak sauce. Canned and packaged gravies. Fish sauce. Oyster sauce. Cocktail sauce. Store-bought horseradish. Ketchup. Mustard. Meat flavorings and tenderizers. Bouillon cubes. Hot sauces. Pre-made or packaged marinades. Pre-made or packaged taco seasonings. Relishes. Regular salad dressings. Other foods Salted popcorn and pretzels. The items listed above may not be a complete list of foods and beverages you should avoid. Contact a dietitian for more information. Where to find more information National Heart, Lung, and Blood Institute: https://[place]-eaton.com/ American Heart Association: www.heart.org Academy of Nutrition and Dietetics: www.eatright.[place]: www.kidney.org  Summary The DASH eating plan is a healthy eating plan that has been shown to reduce high blood pressure (hypertension). It may also reduce your risk for type 2 diabetes, heart disease, and stroke. When on the DASH eating plan, aim to eat more fresh fruits and vegetables, whole grains,  lean proteins, low-fat dairy, and heart-healthy fats. With the DASH eating plan, you should limit salt (sodium) intake to 2,300 mg a day. If you have hypertension, you may need to reduce your sodium intake to 1,500 mg a day. Work with your health care provider or dietitian to adjust your eating plan to your individual calorie needs. This information is not intended to replace advice given to you by your health care provider. Make sure you discuss any questions you have with your health care provider. Document Revised: 12/26/2018 Document Reviewed: 12/26/2018 Elsevier Patient Education  [place].

## 2022-03-22 NOTE — Telephone Encounter (Unsigned)
Copied from [place]. Topic: General - Other >> Mar 22, 2022 11:42 AM Cyndi Bender wrote: Reason for CRM: Butch Penny with [facility] reports that they are not in network with pt insurance plan so pt may need to be referred to [facility].

## 2022-04-04 ENCOUNTER — Other Ambulatory Visit (HOSPITAL_COMMUNITY): Payer: Self-pay

## 2022-04-04 ENCOUNTER — Telehealth: Payer: Self-pay

## 2022-04-04 NOTE — Telephone Encounter (Signed)
PA request received via CMM through [place] for Trelegy.  PA has been faxed to plan via CMM and is pending determination..  Key: [id number]

## 2022-04-05 NOTE — Telephone Encounter (Signed)
His denial letter said his insurance will cover Advair, Dulera, or Symbicort. Do you want to switch his medication?

## 2022-04-05 NOTE — Telephone Encounter (Signed)
I left a message for the patient to return my call.

## 2022-04-05 NOTE — Telephone Encounter (Signed)
PA has been DENIED. Denial letter has been attached in patient documents.

## 2022-04-05 NOTE — Telephone Encounter (Signed)
He has very severe COPD with asthma overlap.  He needs TRIPLE therapy with LABA/LAMA/ICS.  We can use Symbicort 160/4.5, 2 inhalations twice a day AND Spiriva Respimat 2.5 mcg 2 puffs daily.  But decreasing his therapy from triple therapy to double therapy is not best practice.

## 2022-04-10 NOTE — Telephone Encounter (Signed)
I left another message for the patient to return my call. I will try to call him one more time due to the nature of this call.

## 2022-04-13 ENCOUNTER — Encounter: Payer: Self-pay | Admitting: Internal Medicine

## 2022-04-16 MED ORDER — SPIRIVA RESPIMAT 2.5 MCG/ACT IN AERS: 2.0000 | INHALATION_SPRAY | Freq: Every day | RESPIRATORY_TRACT | 6 refills | Status: DC

## 2022-04-16 MED ORDER — BUDESONIDE-FORMOTEROL FUMARATE 160-4.5 MCG/ACT IN AERO: 2.0000 | INHALATION_SPRAY | Freq: Two times a day (BID) | RESPIRATORY_TRACT | 6 refills | Status: DC

## 2022-04-16 NOTE — Addendum Note (Signed)
Addended by: Hettie Holstein on: 04/16/2022 02:01 PM   Modules accepted: Orders

## 2022-04-16 NOTE — Telephone Encounter (Signed)
I have notified the patient. He does have samples of the Trelegy that his PCP gave him. When he finishes the samples he will start the Symbicort and Spiriva. I have sent the prescriptions into his pharmacy.  Nothing further is needed.

## 2022-05-24 ENCOUNTER — Ambulatory Visit: Payer: Medicaid Other | Admitting: Cardiology

## 2022-06-19 ENCOUNTER — Ambulatory Visit: Payer: Medicaid Other | Admitting: Internal Medicine

## 2022-06-22 ENCOUNTER — Ambulatory Visit (INDEPENDENT_AMBULATORY_CARE_PROVIDER_SITE_OTHER): Payer: Medicaid Other | Admitting: Family Medicine

## 2022-06-22 ENCOUNTER — Ambulatory Visit: Payer: Self-pay

## 2022-06-22 ENCOUNTER — Encounter: Payer: Self-pay | Admitting: Family Medicine

## 2022-06-22 VITALS — BP 152/82 | HR 75 | Temp 97.8°F | Resp 16 | Ht 71.0 in | Wt 143.0 lb

## 2022-06-22 DIAGNOSIS — J441 Chronic obstructive pulmonary disease with (acute) exacerbation: Secondary | ICD-10-CM | POA: Diagnosis not present

## 2022-06-22 DIAGNOSIS — J4541 Moderate persistent asthma with (acute) exacerbation: Secondary | ICD-10-CM | POA: Diagnosis not present

## 2022-06-22 MED ORDER — PREDNISONE 20 MG PO TABS: ORAL_TABLET | ORAL | 0 refills | Status: DC

## 2022-06-22 MED ORDER — ALBUTEROL SULFATE (2.5 MG/3ML) 0.083% IN NEBU
2.5000 mg | INHALATION_SOLUTION | Freq: Once | RESPIRATORY_TRACT | Status: AC
  Administered 2022-06-22: 2.5 mg via RESPIRATORY_TRACT

## 2022-06-22 MED ORDER — TRELEGY ELLIPTA 200-62.5-25 MCG/ACT IN AEPB: 1.0000 | INHALATION_SPRAY | Freq: Every day | RESPIRATORY_TRACT | 0 refills | Status: DC

## 2022-06-22 MED ORDER — AZITHROMYCIN 250 MG PO TABS: ORAL_TABLET | ORAL | 0 refills | Status: AC

## 2022-06-22 MED ORDER — SYMBICORT 160-4.5 MCG/ACT IN AERO: 2.0000 | INHALATION_SPRAY | Freq: Two times a day (BID) | RESPIRATORY_TRACT | 1 refills | Status: DC

## 2022-06-22 MED ORDER — NEBULIZER/TUBING/MOUTHPIECE KIT: PACK | 0 refills | Status: AC

## 2022-06-22 MED ORDER — IPRATROPIUM-ALBUTEROL 0.5-2.5 (3) MG/3ML IN SOLN: 3.0000 mL | Freq: Three times a day (TID) | RESPIRATORY_TRACT | 1 refills | Status: AC | PRN

## 2022-06-22 NOTE — Progress Notes (Signed)
Patient ID: Dakota Blackwell, male    DOB: 01/21/1961, 62 y.o.   MRN: [id number]  PCP: Margarita Mail, DO  Chief Complaint  Patient presents with   Cough    Onset for weeks on/off. Previous visit was given meds that help but started back up again   Shortness of Breath    Subjective:   Dakota Blackwell is a 62 y.o. male, presents to clinic with CC of the following:  HPI  Back with cough and worse SOB  He cannot afford his maintenance inhalers Breathing worse at night Only has albuterol and it only helps a little Sees pulmonology but doesn't remember when he was last there or when he has next f/up Cough started to be worse about 2 weeks ago, productive with sputum  He states last OV with PCP she gave hime something that helped for a little bit but when he ran out sx worsened again I only see trelegy sample given About 6 months ago he had steroids There is tessalon on chart - otherwise no other meds per PCP besides BP meds - uncontrolled on 3 meds she already referred to cardiology    Patient Active Problem List   Diagnosis Date Noted   Moderate asthma 05/11/2021   Hypertension 04/13/2021   Allergic rhinitis 04/10/2021   History of tobacco use 02/13/2021   Shortness of breath 02/13/2021      Current Outpatient Medications:    albuterol (VENTOLIN HFA) 108 (90 Base) MCG/ACT inhaler, Inhale 2 puffs into the lungs every 6 (six) hours as needed for wheezing or shortness of breath., Disp: 8 g, Rfl: 6   amLODipine (NORVASC) 10 MG tablet, Take 1 tablet (10 mg total) by mouth daily., Disp: 90 tablet, Rfl: 1   benzonatate (TESSALON) 100 MG capsule, Take 1 capsule (100 mg total) by mouth 2 (two) times daily as needed for cough., Disp: 20 capsule, Rfl: 0   Blood Pressure Monitoring (ADULT BLOOD PRESSURE CUFF LG) KIT, 1 each by Does not apply route daily., Disp: 1 kit, Rfl: 0   budesonide-formoterol (SYMBICORT) 160-4.5 MCG/ACT inhaler, Inhale 2 puffs into the lungs  in the morning and at bedtime., Disp: 1 each, Rfl: 6   chlorthalidone (HYGROTON) 25 MG tablet, TAKE 1 TABLET(25 MG) BY MOUTH DAILY, Disp: 90 tablet, Rfl: 1   fluticasone (FLONASE) 50 MCG/ACT nasal spray, Place 2 sprays into both nostrils daily., Disp: 16 g, Rfl: 6   Nebivolol HCl 20 MG TABS, TAKE 1 TABLET(20 MG) BY MOUTH TWICE DAILY, Disp: 180 tablet, Rfl: 1   Tiotropium Bromide Monohydrate (SPIRIVA RESPIMAT) 2.5 MCG/ACT AERS, Inhale 2 puffs into the lungs daily., Disp: 1 each, Rfl: 6   Allergies  Allergen Reactions   Ace Inhibitors Swelling     Social History   Tobacco Use   Smoking status: Former    Packs/day: 0.50    Years: 15.00    Additional pack years: 0.00    Total pack years: 7.50    Types: Cigarettes    Quit date: 2013    Years since quitting: 11.3   Smokeless tobacco: Never  Vaping Use   Vaping Use: Never used  Substance Use Topics   Alcohol use: Never   Drug use: Never      Chart Review Today: I personally reviewed active problem list, medication list, allergies, family history, social history, health maintenance, notes from last encounter, lab results, imaging with the patient/caregiver today.   Review of Systems  Constitutional: Negative.  Negative  for fatigue and fever.  HENT:  Positive for congestion, postnasal drip and rhinorrhea.   Eyes: Negative.   Respiratory:  Positive for cough, shortness of breath and wheezing.   Cardiovascular: Negative.  Negative for chest pain and palpitations.  Gastrointestinal: Negative.   Endocrine: Negative.   Genitourinary: Negative.   Musculoskeletal: Negative.   Skin: Negative.   Allergic/Immunologic: Negative.   Neurological: Negative.   Hematological: Negative.   Psychiatric/Behavioral: Negative.    All other systems reviewed and are negative.      Objective:   Vitals:   06/22/22 1502 06/22/22 1524  BP: (!) 172/84 (!) 152/82  Pulse: 75   Resp: 16   Temp: 97.8 F (36.6 C)   TempSrc: Oral   SpO2: 93%    Weight: 143 lb (64.9 kg)   Height: 5\' 11"  (1.803 m)     Body mass index is 19.94 kg/m.  Physical Exam Vitals and nursing note reviewed.  Constitutional:      General: He is not in acute distress.    Appearance: Normal appearance. He is well-developed. He is not ill-appearing, toxic-appearing or diaphoretic.  HENT:     Head: Normocephalic and atraumatic.     Nose: Nose normal.  Eyes:     General:        Right eye: No discharge.        Left eye: No discharge.     Conjunctiva/sclera: Conjunctivae normal.  Neck:     Trachea: No tracheal deviation.  Cardiovascular:     Rate and Rhythm: Normal rate and regular rhythm.  Pulmonary:     Effort: Pulmonary effort is normal. Tachypnea (mild) present. No accessory muscle usage, prolonged expiration, respiratory distress or retractions.     Breath sounds: No stridor or transmitted upper airway sounds. Examination of the left-upper field reveals decreased breath sounds. Examination of the left-middle field reveals decreased breath sounds. Examination of the left-lower field reveals decreased breath sounds. Decreased breath sounds, wheezing and rhonchi present. No rales.  Musculoskeletal:        General: Normal range of motion.  Skin:    General: Skin is warm and dry.     Findings: No rash.  Neurological:     Mental Status: He is alert.     Motor: No abnormal muscle tone.     Coordination: Coordination normal.  Psychiatric:        Behavior: Behavior normal.      Results for orders placed or performed during the hospital encounter of 07/10/21  Alpha-1-Antitrypsin Phenotyp  Result Value Ref Range   A-1 Antitrypsin Pheno MS    A-1 Antitrypsin, Ser 154 101 - 187 mg/dL       Assessment & Plan:   1. COPD exacerbation (HCC) Very tight and wheezy on exam it did improve with neb tx in office, left lung fields were moving more air but continued to have inspiratory and expiratory wheeze  Plan for pt to use trelegy hold other meds - not  taking correct meds anyhow Use nebs/rescue inhaler prn Steroids and zpak (more aggressive given his dyspnea and lung exam today and undermanaged conditions at baseline) Close f/up  - predniSONE (DELTASONE) 20 MG tablet; 3 tabs poqday 1-3, 2 tabs poqday 4-6, 1 tab poqday 7-9  Dispense: 18 tablet; Refill: 0 - ipratropium-albuterol (DUONEB) 0.5-2.5 (3) MG/3ML SOLN; Take 3 mLs by nebulization 3 (three) times daily as needed (SOB, cough, wheeze).  Dispense: 180 mL; Refill: 1 - Respiratory Therapy Supplies (NEBULIZER/TUBING/MOUTHPIECE) KIT; Disp one nebulizer  machine, tubing set and mouthpiece kit  Dispense: 1 kit; Refill: 0 - Fluticasone-Umeclidin-Vilant (TRELEGY ELLIPTA) 200-62.5-25 MCG/ACT AEPB; Inhale 1 puff into the lungs daily.  Dispense: 28 each; Refill: 0 - azithromycin (ZITHROMAX) 250 MG tablet; Take 2 tablets on day 1, then 1 tablet daily on days 2 through 5  Dispense: 6 tablet; Refill: 0 - SYMBICORT 160-4.5 MCG/ACT inhaler; Inhale 2 puffs into the lungs in the morning and at bedtime.  Dispense: 1 each; Refill: 1 - albuterol (PROVENTIL) (2.5 MG/3ML) 0.083% nebulizer solution 2.5 mg  2. Moderate persistent asthma with acute exacerbation See above - predniSONE (DELTASONE) 20 MG tablet; 3 tabs poqday 1-3, 2 tabs poqday 4-6, 1 tab poqday 7-9  Dispense: 18 tablet; Refill: 0 - ipratropium-albuterol (DUONEB) 0.5-2.5 (3) MG/3ML SOLN; Take 3 mLs by nebulization 3 (three) times daily as needed (SOB, cough, wheeze).  Dispense: 180 mL; Refill: 1 - Respiratory Therapy Supplies (NEBULIZER/TUBING/MOUTHPIECE) KIT; Disp one nebulizer machine, tubing set and mouthpiece kit  Dispense: 1 kit; Refill: 0 - Fluticasone-Umeclidin-Vilant (TRELEGY ELLIPTA) 200-62.5-25 MCG/ACT AEPB; Inhale 1 puff into the lungs daily.  Dispense: 28 each; Refill: 0 - azithromycin (ZITHROMAX) 250 MG tablet; Take 2 tablets on day 1, then 1 tablet daily on days 2 through 5  Dispense: 6 tablet; Refill: 0 - SYMBICORT 160-4.5 MCG/ACT inhaler;  Inhale 2 puffs into the lungs in the morning and at bedtime.  Dispense: 1 each; Refill: 1 - albuterol (PROVENTIL) (2.5 MG/3ML) 0.083% nebulizer solution 2.5 mg    Give trelegy samples again today Neb machine order and duonebs sent in Steroid burst with zpak   ER precautions reviewed  Pt recommended to f/up with PCP or pulmonology in the next 2-4 weeks for recheck  Return for 2-4 week f/up with PCP or pulmonology for recheck .   Danelle Berry, PA-C 06/22/22 3:26 PM

## 2022-06-22 NOTE — Telephone Encounter (Signed)
  Chief Complaint: Cough - sob Symptoms: above  - producing green phlegm Frequency: 2 weeks Pertinent Negatives: Patient denies  Disposition: [] ED /[] Urgent Care (no appt availability in office) / [x] Appointment(In office/virtual)/ []  [facility]/ [] Home Care/ [] Refused Recommended Disposition /[] [place] [facility]/ []  Follow-up with PCP Additional Notes: Spoke with pt's mother. She states that pt has had this for 2 weeks. It started as a cough and has gotten worse. Mother states that, "Dr. Caralee Ates gave him a pill last time this happened that cleared it right up".  Appt scheduled for this afternoon outside of suggested time window of 4 hours. Please advise.  Reason for Disposition  [1] Longstanding difficulty breathing (e.g., CHF, COPD, emphysema) AND [2] WORSE than normal  Answer Assessment - Initial Assessment Questions 1. RESPIRATORY STATUS: "Describe your breathing?" (e.g., wheezing, shortness of breath, unable to speak, severe coughing)      Coughing producing green phlegm 2. ONSET: "When did this breathing problem begin?"      2 weeks   4. SEVERITY: "How bad is your breathing?" (e.g., mild, moderate, severe)    - MILD: No SOB at rest, mild SOB with walking, speaks normally in sentences, can lie down, no retractions, pulse < 100.    - MODERATE: SOB at rest, SOB with minimal exertion and prefers to sit, cannot lie down flat, speaks in phrases, mild retractions, audible wheezing, pulse 100-120.    - SEVERE: Very SOB at rest, speaks in single words, struggling to breathe, sitting hunched forward, retractions, pulse > 120      Mild - moderate 7. LUNG HISTORY: "Do you have any history of lung disease?"  (e.g., pulmonary embolus, asthma, emphysema)     Yes - COPD 8. CAUSE: "What do you think is causing the breathing problem?"      Cough 9. OTHER SYMPTOMS: "Do you have any other symptoms? (e.g., dizziness, runny nose, cough, chest pain, fever)     Cough, SOB  Protocols  used: Breathing Difficulty-A-AH

## 2022-07-09 ENCOUNTER — Ambulatory Visit: Payer: Medicaid Other | Admitting: Internal Medicine

## 2022-07-26 ENCOUNTER — Other Ambulatory Visit: Payer: Self-pay | Admitting: Internal Medicine

## 2022-07-26 DIAGNOSIS — I1A0 Resistant hypertension: Secondary | ICD-10-CM

## 2022-07-26 LAB — COLOGUARD

## 2022-07-26 NOTE — Telephone Encounter (Signed)
Unable to refill per protocol, Rx request is too soon. Last refill 03/21/22 for 90 and 1 refill.  Requested Prescriptions  Pending Prescriptions Disp Refills   chlorthalidone (HYGROTON) 25 MG tablet [Pharmacy Med Name: CHLORTHALIDONE 25MG  TABLETS] 90 tablet 1    Sig: TAKE 1 TABLET(25 MG) BY MOUTH DAILY     Cardiovascular: Diuretics - Thiazide Failed - 07/26/2022  2:58 PM      Failed - Cr in normal range and within 180 days    Creat  Date Value Ref Range Status  05/11/2021 0.85 0.70 - 1.35 mg/dL Final         Failed - K in normal range and within 180 days    Potassium  Date Value Ref Range Status  05/11/2021 4.0 3.5 - 5.3 mmol/L Final         Failed - Na in normal range and within 180 days    Sodium  Date Value Ref Range Status  05/11/2021 133 (L) 135 - 146 mmol/L Final         Failed - Last BP in normal range    BP Readings from Last 1 Encounters:  06/22/22 (!) 152/82         Passed - Valid encounter within last 6 months    Recent Outpatient Visits           1 month ago COPD exacerbation [facility])   [facility], Sheliah Mends, PA-C   4 months ago Resistant hypertension   [facility] Margarita Mail, DO   7 months ago Hypertension, unspecified type   [facility] Margarita Mail, DO   8 months ago Hypertension, unspecified type   [facility] Margarita Mail, DO   10 months ago Hypertension, unspecified type   [facility] Margarita Mail, DO       Future Appointments             In 1 week Margarita Mail, DO [facility], [facility]

## 2022-08-06 ENCOUNTER — Other Ambulatory Visit: Payer: Self-pay | Admitting: Pulmonary Disease

## 2022-08-06 ENCOUNTER — Other Ambulatory Visit: Payer: Self-pay | Admitting: Internal Medicine

## 2022-08-06 DIAGNOSIS — J441 Chronic obstructive pulmonary disease with (acute) exacerbation: Secondary | ICD-10-CM

## 2022-08-06 DIAGNOSIS — I1A0 Resistant hypertension: Secondary | ICD-10-CM

## 2022-08-06 DIAGNOSIS — J4541 Moderate persistent asthma with (acute) exacerbation: Secondary | ICD-10-CM

## 2022-08-06 NOTE — Progress Notes (Unsigned)
Established Office Visit  Subjective:    Patient ID: Dakota Blackwell, male    DOB: [birth year], 62 y.o.   MRN: [id number]  No chief complaint on file.   HPI Patient is in today for follow up on blood pressure. He is here with his wife.   Hypertension: -Medications: Amlodipine 10 mg, Chlorothiazide 25 mg, Bystolic 20 mg BID  Medications Failed: ACEI caused facial swelling, HCTZ caused rash -Checking BP at home (average): not checking, does not have a working cuff -Denies any SOB, CP, vision changes, LE edema or symptoms of hypotension. New medication without side effects  -Diet: working on cutting down in sodium and has completely cut out alcohol  -Exercise: no routine but likes to walk  Severe COPD: -Following with Pulmonology, note reviewed from 07/10/21 -COPD status: exacerbated -Current Treatments: Trelegy, Albuterol PRN -Satisfied with current treatment?: yes -Influenza: Up to Date, politely declines Pneumonia vaccine   History of Elevated LFTs: -AST had been elevated to 41, ALT normal, Na 132 on CMP 3/23.  On recheck labs from 05/11/2021 liver enzymes returned back to normal, AST 23, ALT 14.  Total bilirubin slightly elevated at 1.3 and sodium slightly low at 133 -MCV elevated as well on CBC to 101.1, recheck at 100.7.  Vitamin B12, folate and thiamine all within normal limits. -Had been drinking multiple beers a day most days but has stopped drinking alcohol completely since our last visit, continues to maintain this.  Health Maintenance: -Blood work UTD -Colon cancer screening due, had an issue getting the Cologuard delivered, will reorder -Continues to decline prevnar 20   Family History  Problem Relation Age of Onset   COPD Father    Cancer Father        bone    Social History   Socioeconomic History   Marital status: Single    Spouse name: Not on file   Number of children: Not on file   Years of education: Not on file   Highest education level: Not on  file  Occupational History   Not on file  Tobacco Use   Smoking status: Former    Packs/day: 0.50    Years: 15.00    Additional pack years: 0.00    Total pack years: 7.50    Types: Cigarettes    Quit date: 2013    Years since quitting: 11.5   Smokeless tobacco: Never  Vaping Use   Vaping Use: Never used  Substance and Sexual Activity   Alcohol use: Never   Drug use: Never   Sexual activity: Not on file  Other Topics Concern   Not on file  Social History Narrative   Not on file   Social Determinants of Health   Financial Resource Strain: Low Risk  (04/13/2021)   Overall Financial Resource Strain (CARDIA)    Difficulty of Paying Living Expenses: Not hard at all  Food Insecurity: No Food Insecurity (04/13/2021)   Hunger Vital Sign    Worried About Running Out of Food in the Last Year: Never true    Ran Out of Food in the Last Year: Never true  Transportation Needs: No Transportation Needs (04/13/2021)   PRAPARE - Administrator, Civil Service (Medical): No    Lack of Transportation (Non-Medical): No  Physical Activity: Inactive (04/13/2021)   Exercise Vital Sign    Days of Exercise per Week: 0 days    Minutes of Exercise per Session: 0 min  Stress: No Stress Concern Present (04/13/2021)  [employer] of Occupational Health - Occupational Stress Questionnaire    Feeling of Stress : Not at all  Social Connections: Socially Isolated (04/13/2021)   Social Connection and Isolation Panel [NHANES]    Frequency of Communication with Friends and Family: Once a week    Frequency of Social Gatherings with Friends and Family: Once a week    Attends Religious Services: Never    Database administrator or Organizations: No    Attends Banker Meetings: Never    Marital Status: Never married  Intimate Partner Violence: Not At Risk (04/13/2021)   Humiliation, Afraid, Rape, and Kick questionnaire    Fear of Current or Ex-Partner: No    Emotionally Abused: No     Physically Abused: No    Sexually Abused: No    Outpatient Medications Prior to Visit  Medication Sig Dispense Refill   albuterol (VENTOLIN HFA) 108 (90 Base) MCG/ACT inhaler Inhale 2 puffs into the lungs every 6 (six) hours as needed for wheezing or shortness of breath. 8 g 6   amLODipine (NORVASC) 10 MG tablet Take 1 tablet (10 mg total) by mouth daily. 90 tablet 1   benzonatate (TESSALON) 100 MG capsule Take 1 capsule (100 mg total) by mouth 2 (two) times daily as needed for cough. 20 capsule 0   Blood Pressure Monitoring (ADULT BLOOD PRESSURE CUFF LG) KIT 1 each by Does not apply route daily. 1 kit 0   budesonide-formoterol (SYMBICORT) 160-4.5 MCG/ACT inhaler Inhale 2 puffs into the lungs in the morning and at bedtime. (Patient not taking: Reported on 06/22/2022) 1 each 6   chlorthalidone (HYGROTON) 25 MG tablet TAKE 1 TABLET(25 MG) BY MOUTH DAILY 90 tablet 1   fluticasone (FLONASE) 50 MCG/ACT nasal spray Place 2 sprays into both nostrils daily. 16 g 6   Fluticasone-Umeclidin-Vilant (TRELEGY ELLIPTA) 200-62.5-25 MCG/ACT AEPB Inhale 1 puff into the lungs daily. 28 each 0   ipratropium-albuterol (DUONEB) 0.5-2.5 (3) MG/3ML SOLN Take 3 mLs by nebulization 3 (three) times daily as needed (SOB, cough, wheeze). 180 mL 1   Nebivolol HCl 20 MG TABS TAKE 1 TABLET(20 MG) BY MOUTH TWICE DAILY 180 tablet 1   predniSONE (DELTASONE) 20 MG tablet 3 tabs poqday 1-3, 2 tabs poqday 4-6, 1 tab poqday 7-9 18 tablet 0   Respiratory Therapy Supplies (NEBULIZER/TUBING/MOUTHPIECE) KIT Disp one nebulizer machine, tubing set and mouthpiece kit 1 kit 0   SYMBICORT 160-4.5 MCG/ACT inhaler Inhale 2 puffs into the lungs in the morning and at bedtime. 1 each 1   Tiotropium Bromide Monohydrate (SPIRIVA RESPIMAT) 2.5 MCG/ACT AERS Inhale 2 puffs into the lungs daily. (Patient not taking: Reported on 06/22/2022) 1 each 6   No facility-administered medications prior to visit.    Allergies  Allergen Reactions   Ace  Inhibitors Swelling    Review of Systems  Constitutional:  Negative for chills and fever.  Eyes:  Negative for visual disturbance.  Respiratory:  Positive for cough. Negative for shortness of breath and wheezing.   Cardiovascular:  Negative for chest pain, palpitations and leg swelling.  Neurological:  Negative for dizziness and headaches.       Objective:    Physical Exam Constitutional:      Appearance: Normal appearance.  HENT:     Head: Normocephalic and atraumatic.  Eyes:     Conjunctiva/sclera: Conjunctivae normal.  Cardiovascular:     Rate and Rhythm: Normal rate and regular rhythm.  Pulmonary:     Effort: Pulmonary effort is normal.  Breath sounds: Wheezing present.     Comments: Slight wheezing in the bilateral bases  Skin:    General: Skin is warm and dry.     Comments:    Neurological:     General: No focal deficit present.     Mental Status: He is alert. Mental status is at baseline.  Psychiatric:        Mood and Affect: Mood normal.        Behavior: Behavior normal.     There were no vitals taken for this visit.  There were no vitals filed for this visit.   Wt Readings from Last 3 Encounters:  06/22/22 143 lb (64.9 kg)  03/21/22 149 lb 8 oz (67.8 kg)  12/18/21 151 lb 1.6 oz (68.5 kg)   There were no vitals filed for this visit.     Health Maintenance Due  Topic Date Due   DTaP/Tdap/Td (1 - Tdap) Never done   Colonoscopy  Never done      There are no preventive care reminders to display for this patient.   No results found for: "TSH" Lab Results  Component Value Date   WBC 11.7 (H) 05/11/2021   HGB 14.2 05/11/2021   HCT 41.1 05/11/2021   MCV 100.7 (H) 05/11/2021   PLT 192 05/11/2021   Lab Results  Component Value Date   NA 133 (L) 05/11/2021   K 4.0 05/11/2021   CO2 29 05/11/2021   GLUCOSE 94 05/11/2021   BUN 11 05/11/2021   CREATININE 0.85 05/11/2021   BILITOT 1.3 (H) 05/11/2021   AST 23 05/11/2021   ALT 14 05/11/2021    PROT 7.3 05/11/2021   CALCIUM 9.6 05/11/2021   EGFR 99 05/11/2021   Lab Results  Component Value Date   CHOL 194 04/13/2021   Lab Results  Component Value Date   HDL 114 04/13/2021   Lab Results  Component Value Date   LDLCALC 66 04/13/2021   Lab Results  Component Value Date   TRIG 55 04/13/2021   Lab Results  Component Value Date   CHOLHDL 1.7 04/13/2021   No results found for: "HGBA1C"     Assessment & Plan:   1. Resistant hypertension: Blood pressure continues to be high despite being on 3 agents. He is self pay here, he does need further work up to rule out secondary causes of HTN but I am worried about the cost for him. It appears that [facility] is in network for him, a referral to Cardiology will be placed for secondary HTN work up. For now, continue Amlodipine 10 mg, Chlorthalidone 25 mg and Bystolic 40 mg, all refilled. Follow up here in 3 months.   - Ambulatory referral to Cardiology - amLODipine (NORVASC) 10 MG tablet; Take 1 tablet (10 mg total) by mouth daily.  Dispense: 90 tablet; Refill: 1 - chlorthalidone (HYGROTON) 25 MG tablet; TAKE 1 TABLET(25 MG) BY MOUTH DAILY  Dispense: 90 tablet; Refill: 1 - Nebivolol HCl 20 MG TABS; TAKE 1 TABLET(20 MG) BY MOUTH TWICE DAILY  Dispense: 180 tablet; Refill: 1  2. Chronic obstructive pulmonary disease, unspecified COPD type (HCC): Stable, samples of Trelegy provided.   3. Colon cancer screening: Reordered.   - Cologuard   Margarita Mail, DO

## 2022-08-07 ENCOUNTER — Ambulatory Visit: Payer: Medicaid Other | Admitting: Internal Medicine

## 2022-08-07 ENCOUNTER — Encounter: Payer: Self-pay | Admitting: Internal Medicine

## 2022-08-07 VITALS — BP 156/80 | HR 76 | Temp 98.0°F | Resp 16 | Ht 71.0 in | Wt 145.3 lb

## 2022-08-07 DIAGNOSIS — Z125 Encounter for screening for malignant neoplasm of prostate: Secondary | ICD-10-CM | POA: Diagnosis not present

## 2022-08-07 DIAGNOSIS — J449 Chronic obstructive pulmonary disease, unspecified: Secondary | ICD-10-CM | POA: Diagnosis not present

## 2022-08-07 DIAGNOSIS — Z13220 Encounter for screening for lipoid disorders: Secondary | ICD-10-CM

## 2022-08-07 DIAGNOSIS — I10 Essential (primary) hypertension: Secondary | ICD-10-CM | POA: Diagnosis not present

## 2022-08-07 MED ORDER — HYDRALAZINE HCL 10 MG PO TABS
10.0000 mg | ORAL_TABLET | Freq: Every day | ORAL | 0 refills | Status: DC | PRN
Start: 2022-08-07 — End: 2022-11-01

## 2022-08-07 MED ORDER — ADULT BLOOD PRESSURE CUFF LG KIT
1.0000 | PACK | Freq: Every day | 0 refills | Status: AC
Start: 2022-08-07 — End: ?

## 2022-08-07 NOTE — Patient Instructions (Signed)
It was great seeing you today!  Plan discussed at today's visit: -Blood work ordered today, results will be uploaded to MyChart.  -Continue all blood pressure medications, add Hydralazine 10 mg as needed if blood pressure is greater than 150/90 -New referral to Cardiology placed  Follow up in: 3 months   Take care and let us know if you have any questions or concerns prior to your next visit.  Dr. Caralee Ates

## 2022-08-07 NOTE — Telephone Encounter (Signed)
Requested medication (s) are due for refill today: na   Requested medication (s) are on the active medication list: yes   Last refill:  03/21/22 #90 1 refills  Future visit scheduled: yes in 3 months last seen today   Notes to clinic:  protocol failed. Last labs 05/11/21 do you want to refill Rx?     Requested Prescriptions  Pending Prescriptions Disp Refills   chlorthalidone (HYGROTON) 25 MG tablet [Pharmacy Med Name: CHLORTHALIDONE 25MG  TABLETS] 90 tablet 1    Sig: TAKE 1 TABLET(25 MG) BY MOUTH DAILY     Cardiovascular: Diuretics - Thiazide Failed - 08/06/2022  4:56 PM      Failed - Cr in normal range and within 180 days    Creat  Date Value Ref Range Status  05/11/2021 0.85 0.70 - 1.35 mg/dL Final         Failed - K in normal range and within 180 days    Potassium  Date Value Ref Range Status  05/11/2021 4.0 3.5 - 5.3 mmol/L Final         Failed - Na in normal range and within 180 days    Sodium  Date Value Ref Range Status  05/11/2021 133 (L) 135 - 146 mmol/L Final         Failed - Last BP in normal range    BP Readings from Last 1 Encounters:  08/07/22 (!) 156/80         Passed - Valid encounter within last 6 months    Recent Outpatient Visits           Today Hypertension, unspecified type   [facility] Margarita Mail, DO   1 month ago COPD exacerbation [facility])   [facility] Danelle Berry, PA-C   4 months ago Resistant hypertension   [facility] Margarita Mail, DO   7 months ago Hypertension, unspecified type   [facility] Margarita Mail, DO   9 months ago Hypertension, unspecified type   [facility] Margarita Mail, DO       Future Appointments             In 3 months Margarita Mail, DO [facility], [facility]

## 2022-08-08 LAB — COMPLETE METABOLIC PANEL WITH GFR
AG Ratio: 1.3 (calc) (ref 1.0–2.5)
ALT: 9 U/L (ref 9–46)
AST: 20 U/L (ref 10–35)
Albumin: 4.3 g/dL (ref 3.6–5.1)
Alkaline phosphatase (APISO): 67 U/L (ref 35–144)
BUN: 11 mg/dL (ref 7–25)
CO2: 35 mmol/L — ABNORMAL HIGH (ref 20–32)
Calcium: 9.5 mg/dL (ref 8.6–10.3)
Chloride: 82 mmol/L — ABNORMAL LOW (ref 98–110)
Creat: 1.03 mg/dL (ref 0.70–1.35)
Globulin: 3.4 g/dL (calc) (ref 1.9–3.7)
Glucose, Bld: 81 mg/dL (ref 65–99)
Potassium: 3 mmol/L — ABNORMAL LOW (ref 3.5–5.3)
Sodium: 129 mmol/L — ABNORMAL LOW (ref 135–146)
Total Bilirubin: 1.5 mg/dL — ABNORMAL HIGH (ref 0.2–1.2)
Total Protein: 7.7 g/dL (ref 6.1–8.1)
eGFR: 82 mL/min/{1.73_m2} (ref >=60)

## 2022-08-08 LAB — CBC WITH DIFFERENTIAL/PLATELET
Absolute Monocytes: 1273 cells/uL — ABNORMAL HIGH (ref 200–950)
Basophils Absolute: 81 cells/uL (ref 0–200)
Basophils Relative: 0.8 %
Eosinophils Absolute: 51 cells/uL (ref 15–500)
Eosinophils Relative: 0.5 %
HCT: 42.3 % (ref 38.5–50.0)
Hemoglobin: 15.3 g/dL (ref 13.2–17.1)
Lymphs Abs: 2323 cells/uL (ref 850–3900)
MCH: 35.8 pg — ABNORMAL HIGH (ref 27.0–33.0)
MCHC: 36.2 g/dL — ABNORMAL HIGH (ref 32.0–36.0)
MCV: 99.1 fL (ref 80.0–100.0)
MPV: 9.1 fL (ref 7.5–12.5)
Monocytes Relative: 12.6 %
Neutro Abs: 6373 cells/uL (ref 1500–7800)
Neutrophils Relative %: 63.1 %
Platelets: 358 10*3/uL (ref 140–400)
RBC: 4.27 10*6/uL (ref 4.20–5.80)
RDW: 13 % (ref 11.0–15.0)
Total Lymphocyte: 23 %
WBC: 10.1 10*3/uL (ref 3.8–10.8)

## 2022-08-08 LAB — LIPID PANEL
Cholesterol: 169 mg/dL (ref <200)
HDL: 87 mg/dL (ref >=40)
LDL Cholesterol (Calc): 68 mg/dL (calc)
Non-HDL Cholesterol (Calc): 82 mg/dL (calc) (ref <130)
Total CHOL/HDL Ratio: 1.9 (calc) (ref <5.0)
Triglycerides: 48 mg/dL (ref <150)

## 2022-08-08 LAB — PSA: PSA: 1.36 ng/mL (ref <=4.00)

## 2022-08-26 ENCOUNTER — Other Ambulatory Visit: Payer: Self-pay | Admitting: Internal Medicine

## 2022-08-26 DIAGNOSIS — I1A0 Resistant hypertension: Secondary | ICD-10-CM

## 2022-08-28 NOTE — Telephone Encounter (Signed)
Requested Prescriptions  Pending Prescriptions Disp Refills   Nebivolol HCl 20 MG TABS [Pharmacy Med Name: NEBIVOLOL 20MG  TABLETS] 180 tablet 1    Sig: TAKE 1 TABLET BY MOUTH TWICE DAILY     Cardiovascular: Beta Blockers 3 Failed - 08/26/2022  7:06 AM      Failed - Last BP in normal range    BP Readings from Last 1 Encounters:  08/07/22 (!) 156/80         Passed - Cr in normal range and within 360 days    Creat  Date Value Ref Range Status  08/07/2022 1.03 0.70 - 1.35 mg/dL Final         Passed - AST in normal range and within 360 days    AST  Date Value Ref Range Status  08/07/2022 20 10 - 35 U/L Final         Passed - ALT in normal range and within 360 days    ALT  Date Value Ref Range Status  08/07/2022 9 9 - 46 U/L Final         Passed - Last Heart Rate in normal range    Pulse Readings from Last 1 Encounters:  08/07/22 76         Passed - Valid encounter within last 6 months    Recent Outpatient Visits           3 weeks ago Hypertension, unspecified type   [facility] Margarita Mail, DO   2 months ago COPD exacerbation [facility])   [facility] Danelle Berry, PA-C   5 months ago Resistant hypertension   [facility] Margarita Mail, DO   8 months ago Hypertension, unspecified type   [facility] Margarita Mail, DO   9 months ago Hypertension, unspecified type   [facility] Margarita Mail, DO       Future Appointments             In 1 month Agbor-Etang, Arlys John, MD [facility] [place]   In 2 months Margarita Mail, DO [facility], [facility]

## 2022-08-30 ENCOUNTER — Other Ambulatory Visit: Payer: Self-pay | Admitting: Pulmonary Disease

## 2022-08-30 NOTE — Telephone Encounter (Signed)
This will be the last refill we can send in until we see the patient in the office. He is aware of this.

## 2022-09-05 ENCOUNTER — Telehealth: Payer: Self-pay

## 2022-09-05 NOTE — Telephone Encounter (Signed)
PA done through cover my meds waiting on insurance determination

## 2022-09-06 NOTE — Telephone Encounter (Signed)
Dakota Blackwell (Key: [id number]) [phone] Nebivolol HCl 20MG  tablets Status: PA Response - DeniedCreated: July 31st, 2024Sent: July 31st, 2024

## 2022-09-13 ENCOUNTER — Ambulatory Visit: Payer: Medicaid Other | Admitting: Pulmonary Disease

## 2022-09-17 ENCOUNTER — Ambulatory Visit: Payer: Medicaid Other | Admitting: Pulmonary Disease

## 2022-09-17 ENCOUNTER — Encounter: Payer: Self-pay | Admitting: Pulmonary Disease

## 2022-09-17 VITALS — BP 152/80 | HR 97 | Temp 97.6°F | Ht 71.0 in | Wt 147.6 lb

## 2022-09-17 DIAGNOSIS — R0602 Shortness of breath: Secondary | ICD-10-CM | POA: Diagnosis not present

## 2022-09-17 DIAGNOSIS — J449 Chronic obstructive pulmonary disease, unspecified: Secondary | ICD-10-CM

## 2022-09-17 MED ORDER — STIOLTO RESPIMAT 2.5-2.5 MCG/ACT IN AERS: 2.0000 | INHALATION_SPRAY | Freq: Every day | RESPIRATORY_TRACT | 11 refills | Status: DC

## 2022-09-17 MED ORDER — STIOLTO RESPIMAT 2.5-2.5 MCG/ACT IN AERS: 2.0000 | INHALATION_SPRAY | Freq: Every day | RESPIRATORY_TRACT | 0 refills | Status: DC

## 2022-09-17 NOTE — Telephone Encounter (Signed)
Patient's mother Dakota Blackwell called in regards to this medication, Nebivolol HCl 20 MG TABS, being denied by insurance & is requesting another medication for blood pressure to be sent in, please advise.   Patients Callback #:[phone]

## 2022-09-17 NOTE — Patient Instructions (Addendum)
We have switched your inhaler to Stiolto this is basically a little stronger version than your prior inhaler.  You received samples of the same.  We are going to get an overnight oxygen level.  We will see on follow-up in 3 months time call sooner should any new problems arise.

## 2022-09-17 NOTE — Progress Notes (Signed)
Subjective:    Patient ID: Dakota Blackwell, male    DOB: [birth year], 62 y.o.   MRN: [id number]  Patient Care Team: Margarita Mail, DO as PCP - General (Internal Medicine)  Chief Complaint  Patient presents with   Follow-up    SOB with heat. No wheezing or cough.    HPI Patient is a 62 year old remote former smoker who presents for follow-up on the issue of shortness of breath and stage IV COPD. He was initially seen on 10 April 2021.  His last follow-up visit here was on 10 July 2021 and got lost to follow-up after that.  For the details of that visit please refer to that note.  At that visit an alpha 1 antitrypsin was ordered the patient is phenotype MS level normal at 154 mg/dL.  His lung function has been determined to be very limited with an FEV1 in the 21% range. PFTs are consistent with emphysema as well as his chest x-ray.  As noted he got lost to follow-up.  He has not had any fevers, chills or sweats. No cough or sputum production. No hemoptysis. No chest pain, orthopnea or paroxysmal nocturnal dyspnea. No lower extremity edema or calf tenderness. He feels much better main difficulty is inability to afford Trelegy due to cost.  He apparently was switched to Symbicort and Spiriva but does not recall when he used the Symbicort last.  He is using albuterol as needed.  He uses albuterol 2-3 times per week.  He also has a nebulizer in which he uses DuoNeb once a day and this also helps.  He noted that Spiriva helped him greatly.  He is not the most forthcoming historian.  It appears that he had refills of Spiriva at the pharmacy however he had not been filling these.  He seems to get confused with his medications very easily.  He does not endorse any fevers, chills or sweats.  Dyspnea has been pretty much at baseline.  No orthopnea or paroxysmal nocturnal dyspnea.  No lower extremity edema nor calf tenderness.  DATA 04/25/2021 PFTs: FEV1 0.71 L or 21% predicted, FVC 2.18 L or 52%  predicted, FEV1/FVC 32%, no bronchodilator response.  Lung volumes mildly reduced likely due to air trapping normalized postbronchodilator.  Diffusion capacity severely decreased.  Consistent with COPD on the basis of emphysema. 05/01/2021 chest x-ray PA and lateral: Increased lung volumes with emphysematous changes.  No other abnormalities. 07/10/2021 alpha 1 antitrypsin: Phenotype MS, level: 154 mg/dL 40/98/1191 ambulatory oximetry: Only able to ambulate 2 laps, O2 desats to 90% but no further.  Review of Systems A 10 point review of systems was performed and it is as noted above otherwise negative.   Patient Active Problem List   Diagnosis Date Noted   Moderate asthma 05/11/2021   Hypertension 04/13/2021   Allergic rhinitis 04/10/2021   History of tobacco use 02/13/2021   Shortness of breath 02/13/2021    Social History   Tobacco Use   Smoking status: Former    Current packs/day: 0.00    Average packs/day: 0.5 packs/day for 15.0 years (7.5 ttl pk-yrs)    Types: Cigarettes    Start date: 4    Quit date: 2013    Years since quitting: 11.6   Smokeless tobacco: Never  Substance Use Topics   Alcohol use: Never    Allergies  Allergen Reactions   Ace Inhibitors Swelling    Current Meds  Medication Sig   albuterol (VENTOLIN HFA) 108 (90 Base) MCG/ACT  inhaler INHALE 2 PUFFS INTO THE LUNGS EVERY 6 HOURS AS NEEDED FOR WHEEZING OR SHORTNESS OF BREATH   amLODipine (NORVASC) 10 MG tablet Take 1 tablet (10 mg total) by mouth daily.   Blood Pressure Monitoring (ADULT BLOOD PRESSURE CUFF LG) KIT 1 each by Does not apply route daily.   chlorthalidone (HYGROTON) 25 MG tablet TAKE 1 TABLET(25 MG) BY MOUTH DAILY   fluticasone (FLONASE) 50 MCG/ACT nasal spray Place 2 sprays into both nostrils daily.   hydrALAZINE (APRESOLINE) 10 MG tablet Take 1 tablet (10 mg total) by mouth daily as needed (take if BP >150/90).   ipratropium-albuterol (DUONEB) 0.5-2.5 (3) MG/3ML SOLN Take 3 mLs by  nebulization 3 (three) times daily as needed (SOB, cough, wheeze).   Nebivolol HCl 20 MG TABS TAKE 1 TABLET BY MOUTH TWICE DAILY   Respiratory Therapy Supplies (NEBULIZER/TUBING/MOUTHPIECE) KIT Disp one nebulizer machine, tubing set and mouthpiece kit   Tiotropium Bromide Monohydrate (SPIRIVA RESPIMAT) 2.5 MCG/ACT AERS Inhale 2 puffs into the lungs daily.    Immunization History  Administered Date(s) Administered   COVID-19, mRNA, vaccine(Comirnaty)12 years and older 12/08/2021   Influenza,inj,Quad PF,6+ Mos 12/30/2019, 12/26/2020, 11/02/2021   Janssen (J&J) SARS-COV-2 Vaccination 05/18/2019, 12/30/2019   Pfizer Covid-19 Vaccine Bivalent Booster 44yrs & up 12/26/2020        Objective:     BP (!) 152/80 (BP Location: Left Arm, Cuff Size: Normal)   Pulse 97   Temp 97.6 F (36.4 C)   Ht 5\' 11"  (1.803 m)   Wt 147 lb 9.6 oz (67 kg)   SpO2 97%   BMI 20.59 kg/m   SpO2: 97 % O2 Device: None (Room air)  GENERAL: Well-developed, well-nourished gentleman, no acute distress.  No use of accessories today. Fully ambulatory.  No conversational dyspnea. HEAD: Normocephalic, atraumatic.  EYES: Pupils equal, round, reactive to light.  No scleral icterus.  MOUTH: Poor dentition, oral mucosa moist.  No thrush. NECK: Supple. No thyromegaly. Trachea midline. No JVD.  No adenopathy. PULMONARY: Symmetrical breath sounds, very distant breath sounds, poor air movement, no adventitious sounds.   CARDIOVASCULAR: S1 and S2.  Regular rate and rhythm, no rubs murmurs or gallops heard. ABDOMEN: Benign. MUSCULOSKELETAL: No joint deformity, no clubbing, no edema.  NEUROLOGIC: No overt focal deficit, no gait disturbance, speech is fluent. SKIN: Intact,warm,dry. PSYCH: Normal mood, behavior normal   Ambulatory oximetry was performed today: At rest on room air oxygen saturation was 94%, the patient ambulated at a moderate pace, completed 2 laps, O2 nadir 90%, mild shortness of breath.  Resting heart rate  was 89 bpm at maximum for this exercise and 14 bpm.  Assessment & Plan:     ICD-10-CM   1. Stage 4 very severe COPD by GOLD classification (HCC)  J44.9 Overnight Pulse Oximetry Study   Currently undertreated Continue albuterol as needed He felt Spiriva worked best Switch to [employer] (Spiriva plus olodaterol) 2 puffs daily    2. Shortness of breath  R06.02 Overnight Pulse Oximetry Study   Due to poorly compensated COPD Some desaturation on ambulatory oximetry Check overnight oximetry     Orders Placed This Encounter  Procedures   Overnight Pulse Oximetry Study    Room air DME: new start    Standing Status:   Future    Standing Expiration Date:   09/17/2023   Meds ordered this encounter  Medications   Tiotropium Bromide-Olodaterol (STIOLTO RESPIMAT) 2.5-2.5 MCG/ACT AERS    Sig: Inhale 2 puffs into the lungs daily.  Dispense:  12 g    Refill:  0    Order Specific Question:   Lot Number?    Answer:   [id number]    Order Specific Question:   Expiration Date?    Answer:   06/05/2024    Order Specific Question:   Quantity    Answer:   3   Tiotropium Bromide-Olodaterol (STIOLTO RESPIMAT) 2.5-2.5 MCG/ACT AERS    Sig: Inhale 2 puffs into the lungs daily.    Dispense:  4 g    Refill:  11   Will see the patient in follow-up in 3 months time he is to contact us prior to that time should any new difficulties arise.   Gailen Shelter, MD Advanced Bronchoscopy [facility]    *This note was dictated using voice recognition software/Dragon.  Despite best efforts to proofread, errors can occur which can change the meaning. Any transcriptional errors that result from this process are unintentional and may not be fully corrected at the time of dictation.

## 2022-09-18 ENCOUNTER — Other Ambulatory Visit: Payer: Self-pay | Admitting: Internal Medicine

## 2022-09-18 DIAGNOSIS — I10 Essential (primary) hypertension: Secondary | ICD-10-CM

## 2022-09-18 MED ORDER — METOPROLOL SUCCINATE ER 100 MG PO TB24: 100.0000 mg | ORAL_TABLET | Freq: Every day | ORAL | 1 refills | Status: DC

## 2022-09-18 NOTE — Telephone Encounter (Signed)
Pt.notified

## 2022-10-12 ENCOUNTER — Telehealth: Payer: Self-pay

## 2022-10-12 NOTE — Telephone Encounter (Signed)
We received a fax from [employer] saying the shipped a Pulse Oximetry testing device to the patient over 20 days ago and the patient has not returned it.   Lm x1 for the patient.

## 2022-10-15 NOTE — Telephone Encounter (Signed)
Spoke to patient. He stated that he has been out of town. He plans to complete test tonight and will return ONO device.

## 2022-10-17 ENCOUNTER — Telehealth: Payer: Self-pay | Admitting: *Deleted

## 2022-10-17 NOTE — Telephone Encounter (Signed)
Unable to LVM no VM setup to verify card hx.

## 2022-10-23 ENCOUNTER — Ambulatory Visit: Payer: Medicaid Other | Attending: Cardiology | Admitting: Cardiology

## 2022-10-24 ENCOUNTER — Encounter: Payer: Self-pay | Admitting: Cardiology

## 2022-10-31 ENCOUNTER — Other Ambulatory Visit: Payer: Self-pay | Admitting: Internal Medicine

## 2022-10-31 DIAGNOSIS — I10 Essential (primary) hypertension: Secondary | ICD-10-CM

## 2022-11-01 ENCOUNTER — Telehealth: Payer: Self-pay

## 2022-11-01 DIAGNOSIS — J449 Chronic obstructive pulmonary disease, unspecified: Secondary | ICD-10-CM

## 2022-11-01 NOTE — Telephone Encounter (Signed)
Requested Prescriptions  Pending Prescriptions Disp Refills   hydrALAZINE (APRESOLINE) 10 MG tablet [Pharmacy Med Name: HYDRALAZINE 10 MG TABLETS (ORANGE)] 90 tablet 0    Sig: TAKE 1 TABLET BY MOUTH DAILY AS NEEDED(TAKE IF BLOOD PRESSURE IS GREATER THAN 150/90)     Cardiovascular:  Vasodilators Failed - 10/31/2022 11:34 AM      Failed - ANA Screen, Ifa, Serum in normal range and within 360 days    No results found for: "ANA", "ANATITER", "LABANTI"       Failed - Last BP in normal range    BP Readings from Last 1 Encounters:  09/17/22 (!) 152/80         Passed - HCT in normal range and within 360 days    HCT  Date Value Ref Range Status  08/07/2022 42.3 38.5 - 50.0 % Final         Passed - HGB in normal range and within 360 days    Hemoglobin  Date Value Ref Range Status  08/07/2022 15.3 13.2 - 17.1 g/dL Final         Passed - RBC in normal range and within 360 days    RBC  Date Value Ref Range Status  08/07/2022 4.27 4.20 - 5.80 Million/uL Final         Passed - WBC in normal range and within 360 days    WBC  Date Value Ref Range Status  08/07/2022 10.1 3.8 - 10.8 Thousand/uL Final         Passed - PLT in normal range and within 360 days    Platelets  Date Value Ref Range Status  08/07/2022 358 140 - 400 Thousand/uL Final         Passed - Valid encounter within last 12 months    Recent Outpatient Visits           2 months ago Hypertension, unspecified type   [facility] Margarita Mail, DO   4 months ago COPD exacerbation [facility]   [facility] Danelle Berry, PA-C   7 months ago Resistant hypertension   [facility] Margarita Mail, DO   10 months ago Hypertension, unspecified type   [facility] Margarita Mail, DO   11 months ago Hypertension, unspecified type   [facility] Margarita Mail, DO       Future  Appointments             In 1 week Margarita Mail, DO [facility], [facility]

## 2022-11-01 NOTE — Telephone Encounter (Signed)
ONO reviewed by Dr. Jayme Cloud - Low SpO2 85.0%. Qualifies for nocturnal O2 at 2Lpm.  DX: COPD stage 4.   ATC the patient. The phone rang and then it said your call can not be completed at this time. I will try the patient again later.

## 2022-11-02 NOTE — Telephone Encounter (Signed)
I have notified the patient and placed the order for O2.  Nothing further needed.

## 2022-11-07 NOTE — Progress Notes (Deleted)
Established Office Visit  Subjective:    Patient ID: Dakota Blackwell, male    DOB: 06-03-1960, 63 y.o.   MRN: [id number]  No chief complaint on file.   HPI Patient is in today for follow up on blood pressure.   Hypertension: -Medications: Amlodipine 10 mg, Chlorothiazide 25 mg, Bystolic 20 mg BID  Medications Failed: ACEI caused facial swelling, HCTZ caused rash -Checking BP at home (average): not checking, does not have a working cuff -Denies any SOB, CP, vision changes, LE edema or symptoms of hypotension. New medication without side effects  -Diet: working on cutting down in sodium and has completely cut out alcohol  -Exercise: no routine but likes to walk  Severe COPD: -Following with Pulmonology, note reviewed from 07/10/21 -COPD status: exacerbated -Current Treatments: Spirva, Albuterol PRN -Satisfied with current treatment?: yes -Influenza: Up to Date, politely declines Pneumonia vaccine   History of Elevated LFTs: -AST had been elevated to 41, ALT normal, Na 132 on CMP 3/23.  On recheck labs from 05/11/2021 liver enzymes returned back to normal, AST 23, ALT 14.  Total bilirubin slightly elevated at 1.3 and sodium slightly low at 133 -MCV elevated as well on CBC to 101.1, recheck at 100.7.  Vitamin B12, folate and thiamine all within normal limits. -Had been drinking multiple beers a day most days but has stopped drinking alcohol completely since our last visit, continues to maintain this.  Health Maintenance: -Blood work UTD -Colon cancer screening due, had an issue getting the Cologuard delivered -Continues to decline prevnar 20   Family History  Problem Relation Age of Onset   COPD Father    Cancer Father        bone    Social History   Socioeconomic History   Marital status: Single    Spouse name: Not on file   Number of children: Not on file   Years of education: Not on file   Highest education level: Not on file  Occupational History   Not on  file  Tobacco Use   Smoking status: Former    Current packs/day: 0.00    Average packs/day: 0.5 packs/day for 15.0 years (7.5 ttl pk-yrs)    Types: Cigarettes    Start date: 68    Quit date: 2013    Years since quitting: 11.7   Smokeless tobacco: Never  Vaping Use   Vaping status: Never Used  Substance and Sexual Activity   Alcohol use: Never   Drug use: Never   Sexual activity: Not on file  Other Topics Concern   Not on file  Social History Narrative   Not on file   Social Determinants of Health   Financial Resource Strain: Low Risk  (04/13/2021)   Overall Financial Resource Strain (CARDIA)    Difficulty of Paying Living Expenses: Not hard at all  Food Insecurity: No Food Insecurity (04/13/2021)   Hunger Vital Sign    Worried About Running Out of Food in the Last Year: Never true    Ran Out of Food in the Last Year: Never true  Transportation Needs: No Transportation Needs (04/13/2021)   PRAPARE - Administrator, Civil Service (Medical): No    Lack of Transportation (Non-Medical): No  Physical Activity: Inactive (04/13/2021)   Exercise Vital Sign    Days of Exercise per Week: 0 days    Minutes of Exercise per Session: 0 min  Stress: No Stress Concern Present (04/13/2021)   [employer] of Occupational Health -  Occupational Stress Questionnaire    Feeling of Stress : Not at all  Social Connections: Socially Isolated (04/13/2021)   Social Connection and Isolation Panel [NHANES]    Frequency of Communication with Friends and Family: Once a week    Frequency of Social Gatherings with Friends and Family: Once a week    Attends Religious Services: Never    Database administrator or Organizations: No    Attends Banker Meetings: Never    Marital Status: Never married  Intimate Partner Violence: Not At Risk (04/13/2021)   Humiliation, Afraid, Rape, and Kick questionnaire    Fear of Current or Ex-Partner: No    Emotionally Abused: No    Physically Abused:  No    Sexually Abused: No    Outpatient Medications Prior to Visit  Medication Sig Dispense Refill   albuterol (VENTOLIN HFA) 108 (90 Base) MCG/ACT inhaler INHALE 2 PUFFS INTO THE LUNGS EVERY 6 HOURS AS NEEDED FOR WHEEZING OR SHORTNESS OF BREATH 6.7 g 0   amLODipine (NORVASC) 10 MG tablet Take 1 tablet (10 mg total) by mouth daily. 90 tablet 1   Blood Pressure Monitoring (ADULT BLOOD PRESSURE CUFF LG) KIT 1 each by Does not apply route daily. 1 kit 0   chlorthalidone (HYGROTON) 25 MG tablet TAKE 1 TABLET(25 MG) BY MOUTH DAILY 90 tablet 1   fluticasone (FLONASE) 50 MCG/ACT nasal spray Place 2 sprays into both nostrils daily. 16 g 6   hydrALAZINE (APRESOLINE) 10 MG tablet TAKE 1 TABLET BY MOUTH DAILY AS NEEDED(TAKE IF BLOOD PRESSURE IS GREATER THAN 150/90) 90 tablet 0   ipratropium-albuterol (DUONEB) 0.5-2.5 (3) MG/3ML SOLN Take 3 mLs by nebulization 3 (three) times daily as needed (SOB, cough, wheeze). 180 mL 1   metoprolol succinate (TOPROL-XL) 100 MG 24 hr tablet Take 1 tablet (100 mg total) by mouth daily. Take with or immediately following a meal. 90 tablet 1   predniSONE (DELTASONE) 20 MG tablet 3 tabs poqday 1-3, 2 tabs poqday 4-6, 1 tab poqday 7-9 (Patient not taking: Reported on 09/17/2022) 18 tablet 0   Respiratory Therapy Supplies (NEBULIZER/TUBING/MOUTHPIECE) KIT Disp one nebulizer machine, tubing set and mouthpiece kit 1 kit 0   Tiotropium Bromide-Olodaterol (STIOLTO RESPIMAT) 2.5-2.5 MCG/ACT AERS Inhale 2 puffs into the lungs daily. 12 g 0   Tiotropium Bromide-Olodaterol (STIOLTO RESPIMAT) 2.5-2.5 MCG/ACT AERS Inhale 2 puffs into the lungs daily. 4 g 11   No facility-administered medications prior to visit.    Allergies  Allergen Reactions   Ace Inhibitors Swelling    Review of Systems  Constitutional:  Negative for chills and fever.  Eyes:  Negative for visual disturbance.  Respiratory:  Negative for cough, shortness of breath and wheezing.   Cardiovascular:  Negative for  chest pain and palpitations.  Neurological:  Negative for dizziness and headaches.       Objective:    Physical Exam Constitutional:      Appearance: Normal appearance.  HENT:     Head: Normocephalic and atraumatic.  Eyes:     Conjunctiva/sclera: Conjunctivae normal.  Cardiovascular:     Rate and Rhythm: Normal rate and regular rhythm.  Pulmonary:     Effort: Pulmonary effort is normal.     Breath sounds: No wheezing or rales.     Comments: Decreased air movement throughout  Skin:    General: Skin is warm and dry.     Comments:    Neurological:     General: No focal deficit present.  Mental Status: He is alert. Mental status is at baseline.  Psychiatric:        Mood and Affect: Mood normal.        Behavior: Behavior normal.     There were no vitals taken for this visit.  There were no vitals filed for this visit.   Wt Readings from Last 3 Encounters:  09/17/22 147 lb 9.6 oz (67 kg)  08/07/22 145 lb 4.8 oz (65.9 kg)  06/22/22 143 lb (64.9 kg)   There were no vitals filed for this visit.   Health Maintenance Due  Topic Date Due   DTaP/Tdap/Td (1 - Tdap) Never done   Colonoscopy  Never done   Zoster Vaccines- Shingrix (1 of 2) Never done   INFLUENZA VACCINE  09/06/2022    There are no preventive care reminders to display for this patient.   No results found for: "TSH" Lab Results  Component Value Date   WBC 10.1 08/07/2022   HGB 15.3 08/07/2022   HCT 42.3 08/07/2022   MCV 99.1 08/07/2022   PLT 358 08/07/2022   Lab Results  Component Value Date   NA 129 (L) 08/07/2022   K 3.0 (L) 08/07/2022   CO2 35 (H) 08/07/2022   GLUCOSE 81 08/07/2022   BUN 11 08/07/2022   CREATININE 1.03 08/07/2022   BILITOT 1.5 (H) 08/07/2022   AST 20 08/07/2022   ALT 9 08/07/2022   PROT 7.7 08/07/2022   CALCIUM 9.5 08/07/2022   EGFR 82 08/07/2022   Lab Results  Component Value Date   CHOL 169 08/07/2022   Lab Results  Component Value Date   HDL 87 08/07/2022    Lab Results  Component Value Date   LDLCALC 68 08/07/2022   Lab Results  Component Value Date   TRIG 48 08/07/2022   Lab Results  Component Value Date   CHOLHDL 1.9 08/07/2022   No results found for: "HGBA1C"     Assessment & Plan:   1. Hypertension, unspecified type: Blood pressure still uncontrolled, new referral to Cardiology was placed last time but unforunately insurance did not cover, new referral placed. Continue Amlodipine 10 mg, Chlorothiazide 25 mg, Bystolic 20 mg BID, add Hydralazine to use if BP >150/90. Labs due.   - hydrALAZINE (APRESOLINE) 10 MG tablet; Take 1 tablet (10 mg total) by mouth daily as needed (take if BP >150/90).  Dispense: 90 tablet; Refill: 0 - Blood Pressure Monitoring (ADULT BLOOD PRESSURE CUFF LG) KIT; 1 each by Does not apply route daily.  Dispense: 1 kit; Refill: 0 - CBC w/Diff/Platelet - COMPLETE METABOLIC PANEL WITH GFR  2. Chronic obstructive pulmonary disease, unspecified COPD type (HCC): Stable, on Spiriva now.   3. Lipid screening/Prostate cancer screening: Screening ordered.   - Lipid Profile - PSA   Margarita Mail, DO

## 2022-11-08 ENCOUNTER — Ambulatory Visit: Payer: Medicaid Other | Admitting: Internal Medicine

## 2022-11-29 ENCOUNTER — Other Ambulatory Visit: Payer: Self-pay | Admitting: Internal Medicine

## 2022-11-29 DIAGNOSIS — I1A0 Resistant hypertension: Secondary | ICD-10-CM

## 2022-11-30 NOTE — Telephone Encounter (Signed)
Requested Prescriptions  Pending Prescriptions Disp Refills   amLODipine (NORVASC) 10 MG tablet [Pharmacy Med Name: AMLODIPINE BESYLATE 10MG  TABLETS] 90 tablet 0    Sig: TAKE 1 TABLET(10 MG) BY MOUTH DAILY     Cardiovascular: Calcium Channel Blockers 2 Failed - 11/29/2022  1:57 PM      Failed - Last BP in normal range    BP Readings from Last 1 Encounters:  09/17/22 (!) 152/80         Passed - Last Heart Rate in normal range    Pulse Readings from Last 1 Encounters:  09/17/22 97         Passed - Valid encounter within last 6 months    Recent Outpatient Visits           3 months ago Hypertension, unspecified type   [facility] Margarita Mail, DO   5 months ago COPD exacerbation [facility])   [facility] Danelle Berry, PA-C   8 months ago Resistant hypertension   [facility], DO   11 months ago Hypertension, unspecified type   [facility] Margarita Mail, DO   1 year ago Hypertension, unspecified type   [facility] Marg[facility]ita Mail, DO               chlorthalidone (HYGROTON) 25 MG tablet [Pharmacy Med Name: CHLORTHALIDONE 25MG  TABLETS] 90 tablet 0    Sig: TAKE 1 TABLET(25 MG) BY MOUTH DAILY     Cardiovascular: Diuretics - Thiazide Failed - 11/29/2022  1:57 PM      Failed - K in normal range and within 180 days    Potassium  Date Value Ref Range Status  08/07/2022 3.0 (L) 3.5 - 5.3 mmol/L Final         Failed - Na in normal range and within 180 days    Sodium  Date Value Ref Range Status  08/07/2022 129 (L) 135 - 146 mmol/L Final         Failed - Last BP in normal range    BP Readings from Last 1 Encounters:  09/17/22 (!) 152/80         Passed - Cr in normal range and within 180 days    Creat  Date Value Ref Range Status  08/07/2022 1.03 0.70 - 1.35 mg/dL Final         Passed - Valid encounter within  last 6 months    Recent Outpatient Visits           3 months ago Hypertension, unspecified type   [facility] Margarita Mail, DO   5 months ago COPD exacerbation [facility])   [facility] Danelle Berry, PA-C   8 months ago Resistant hypertension   [facility] Margarita Mail, DO   11 months ago Hypertension, unspecified type   [facility]ita Mail, DO   1 year ago Hypertension, unspecified type   [facility] Margarita Mail, [place]

## 2022-12-06 ENCOUNTER — Ambulatory Visit: Payer: Medicaid Other

## 2022-12-06 DIAGNOSIS — Z23 Encounter for immunization: Secondary | ICD-10-CM | POA: Diagnosis not present

## 2022-12-06 DIAGNOSIS — Z719 Counseling, unspecified: Secondary | ICD-10-CM

## 2022-12-06 NOTE — Progress Notes (Signed)
In nurse clinic for immunizations.  Covid Pfizer Comirnaty 2024-25 and flu given and tolerated well. Updated NCIR copy given and explained. Declines to stay for 15 min observation. Jerel Shepherd, RN

## 2022-12-11 IMAGING — CR DG CHEST 2V
1 series · 2 of 2 positions shown · non-contrast
Comparison: No priors.

CLINICAL DATA: 60-year-old male with history of shortness of breath
for 1 year.

EXAM:
CHEST - 2 VIEW

[Series 1: dg chest 2 view · 0.14mm/px · 2 of 2 slices shown]
[im 1/2]
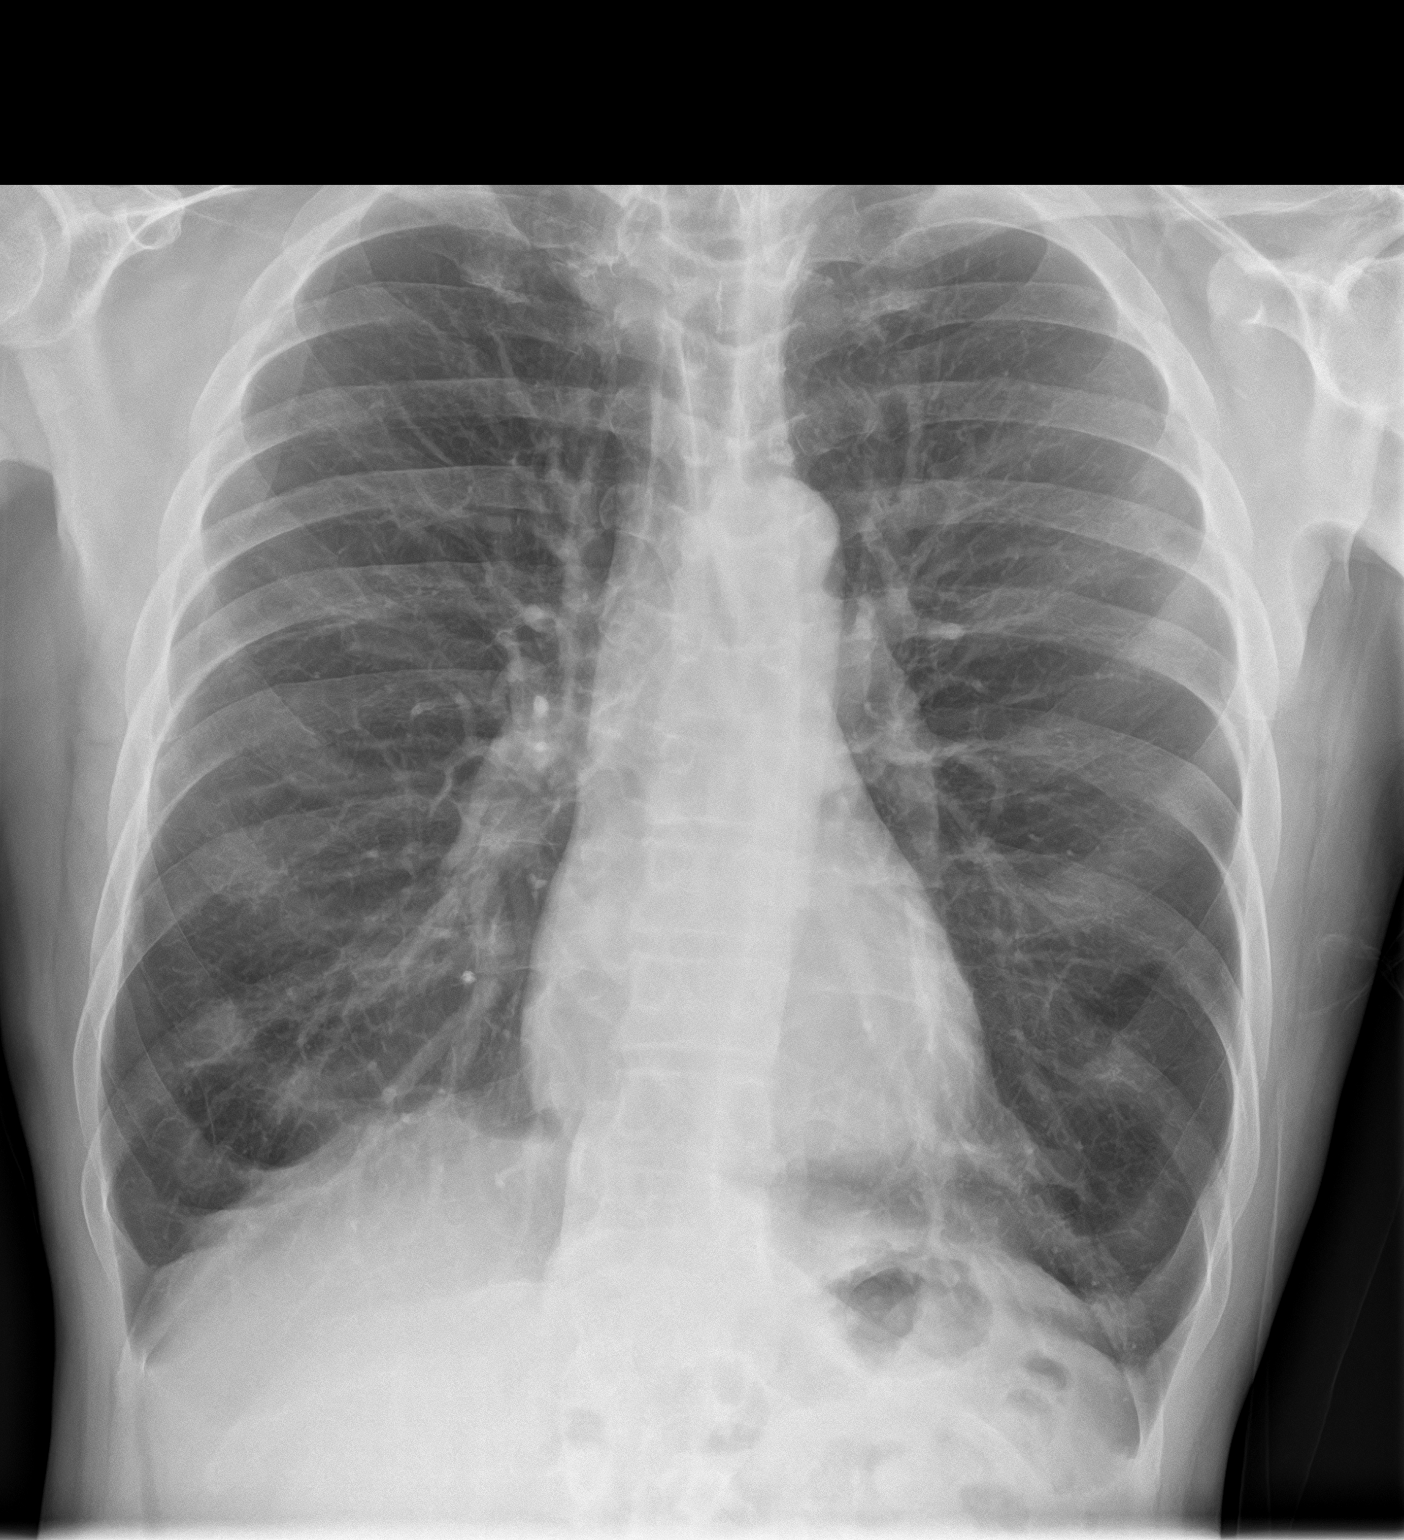
[im 2/2]
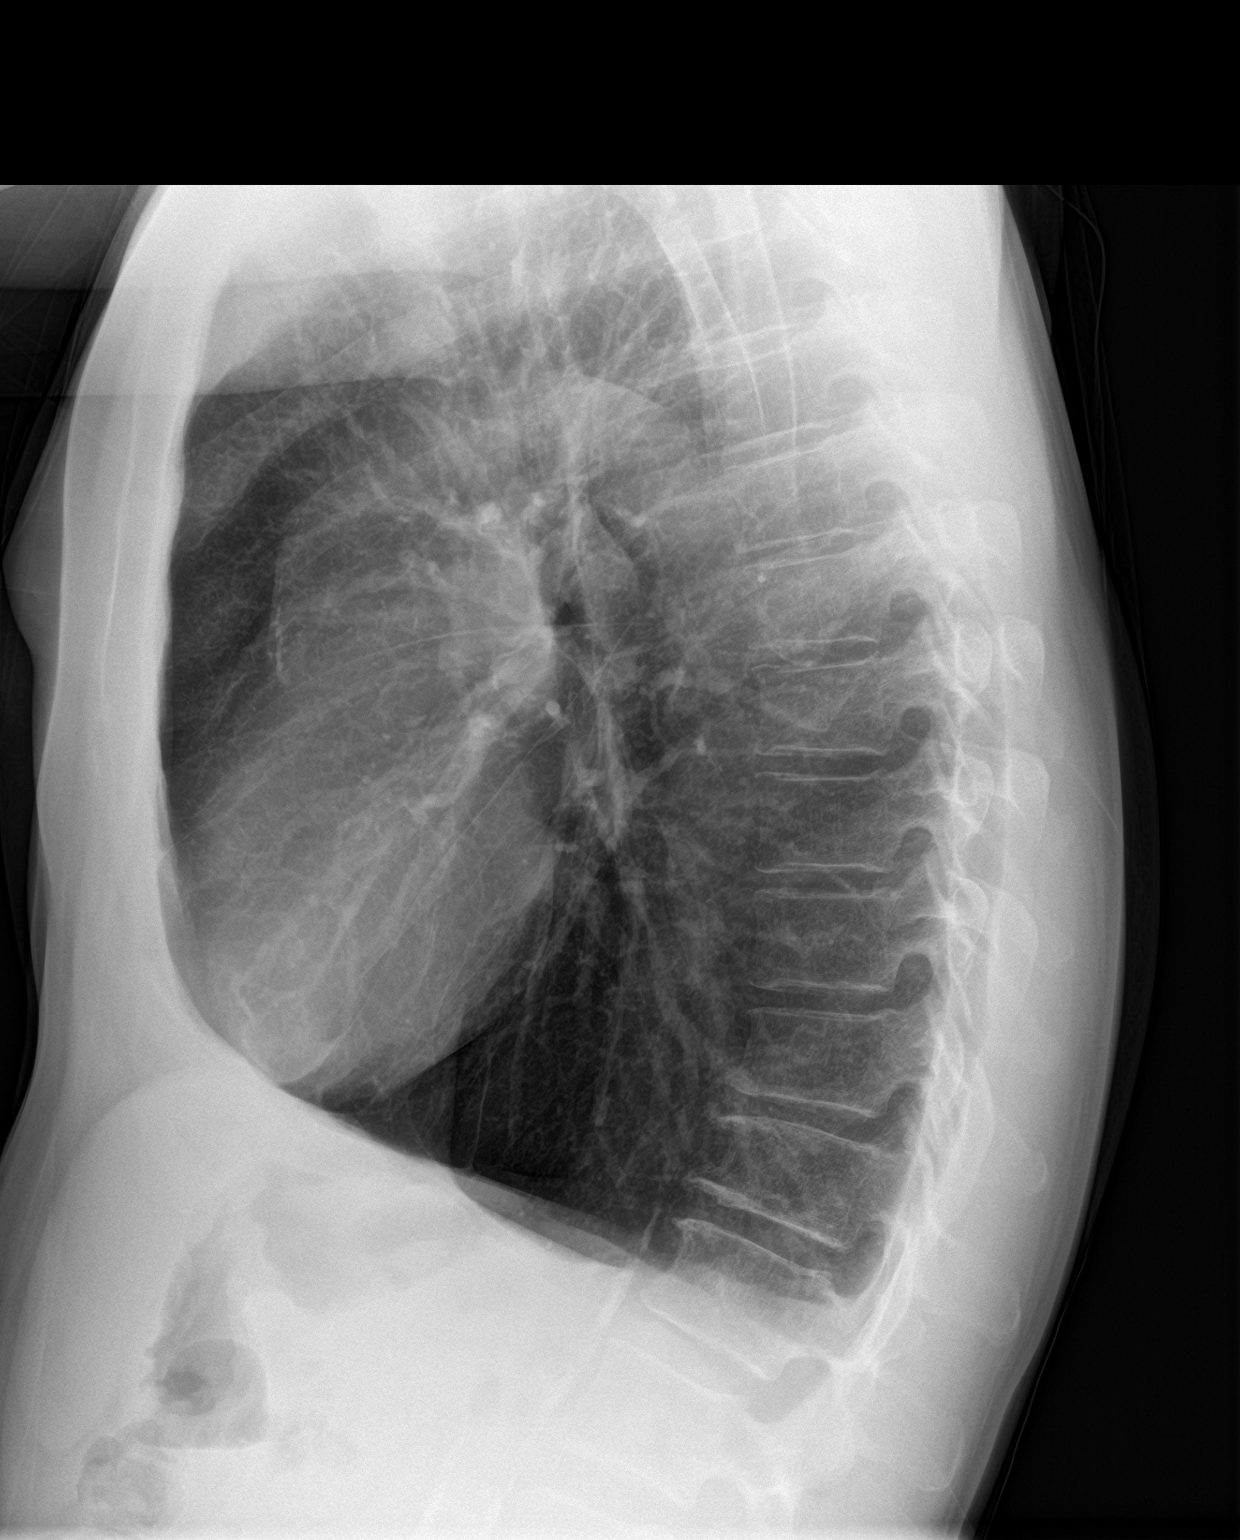

[2 of 2 positions shown; findings below may reference images not displayed]

FINDINGS: Lung volumes are increased with emphysematous changes. No
consolidative airspace disease. No pleural effusions. No
pneumothorax. No pulmonary nodule or mass noted. Pulmonary
vasculature and the cardiomediastinal silhouette are within normal
limits.
IMPRESSION: 1. Emphysema.
2. No radiographic evidence of acute cardiopulmonary disease.

## 2022-12-24 ENCOUNTER — Ambulatory Visit: Payer: Medicaid Other | Admitting: Pulmonary Disease

## 2023-02-04 ENCOUNTER — Other Ambulatory Visit: Payer: Self-pay | Admitting: Internal Medicine

## 2023-02-04 DIAGNOSIS — I10 Essential (primary) hypertension: Secondary | ICD-10-CM

## 2023-02-08 ENCOUNTER — Other Ambulatory Visit: Payer: Self-pay | Admitting: Internal Medicine

## 2023-02-08 DIAGNOSIS — I1A0 Resistant hypertension: Secondary | ICD-10-CM

## 2023-02-08 NOTE — Telephone Encounter (Signed)
 Requested medications are due for refill today.  yes  Requested medications are on the active medications list.  yes  Last refill. 11/01/2022 #90 0 rf  Future visit scheduled.   no  Notes to clinic.  Missing labs    Requested Prescriptions  Pending Prescriptions Disp Refills   hydrALAZINE  (APRESOLINE ) 10 MG tablet [Pharmacy Med Name: HYDRALAZINE  10 MG TABLETS (ORANGE)] 90 tablet 0    Sig: TAKE 1 TABLET BY MOUTH DAILY AS NEEDED. TAKE IF BLOOD PRESSURE IS GREATER THAN 150/90     Cardiovascular:  Vasodilators Failed - 02/08/2023 11:22 AM      Failed - ANA Screen, Ifa, Serum in normal range and within 360 days    No results found for: ANA, ANATITER, LABANTI       Failed - Last BP in normal range    BP Readings from Last 1 Encounters:  09/17/22 (!) 152/80         Passed - HCT in normal range and within 360 days    HCT  Date Value Ref Range Status  08/07/2022 42.3 38.5 - 50.0 % Final         Passed - HGB in normal range and within 360 days    Hemoglobin  Date Value Ref Range Status  08/07/2022 15.3 13.2 - 17.1 g/dL Final         Passed - RBC in normal range and within 360 days    RBC  Date Value Ref Range Status  08/07/2022 4.27 4.20 - 5.80 Million/uL Final         Passed - WBC in normal range and within 360 days    WBC  Date Value Ref Range Status  08/07/2022 10.1 3.8 - 10.8 Thousand/uL Final         Passed - PLT in normal range and within 360 days    Platelets  Date Value Ref Range Status  08/07/2022 358 140 - 400 Thousand/uL Final         Passed - Valid encounter within last 12 months    Recent Outpatient Visits           6 months ago Hypertension, unspecified type   [facility] Bernardo Fend, DO   7 months ago COPD exacerbation [facility])   [facility] 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

## 2023-02-12 NOTE — Telephone Encounter (Signed)
 Called and spoke to pt to make an appt. Pt will call back to make an appt.

## 2023-02-12 NOTE — Telephone Encounter (Signed)
 Courtesy refill. Patient will need an office visit for further refills. Requested Prescriptions  Pending Prescriptions Disp Refills   amLODipine  (NORVASC ) 10 MG tablet [Pharmacy Med Name: AMLODIPINE  BESYLATE 10MG  TABLETS] 30 tablet 0    Sig: TAKE 1 TABLET(10 MG) BY MOUTH DAILY     Cardiovascular: Calcium Channel Blockers 2 Failed - 02/12/2023  8:13 AM      Failed - Last BP in normal range    BP Readings from Last 1 Encounters:  09/17/22 (!) 152/80         Failed - Valid encounter within last 6 months    Recent Outpatient Visits           6 months ago Hypertension, unspecified type   [facility] Bernardo Fend, DO   7 months ago COPD exacerbation [facility])   [facility] Leavy Mole, [facility]-C   10 months 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

## 2023-02-13 ENCOUNTER — Ambulatory Visit: Payer: Medicaid Other | Admitting: Pulmonary Disease

## 2023-02-26 ENCOUNTER — Other Ambulatory Visit: Payer: Self-pay | Admitting: Internal Medicine

## 2023-02-26 DIAGNOSIS — I1A0 Resistant hypertension: Secondary | ICD-10-CM

## 2023-02-26 NOTE — Telephone Encounter (Signed)
Attempted to call patient to schedule appointment- patient is driving and states he will call back to schedule. Courtesy 30 day Rx given Requested Prescriptions  Pending Prescriptions Disp Refills   chlorthalidone (HYGROTON) 25 MG tablet [Pharmacy Med Name: CHLORTHALIDONE 25MG  TABLETS] 30 tablet 0    Sig: TAKE 1 TABLET(25 MG) BY MOUTH DAILY     Cardiovascular: Diuretics - Thiazide Failed - 02/26/2023 12:53 PM      Failed - Cr in normal range and within 180 days    Creat  Date Value Ref Range Status  08/07/2022 1.03 0.70 - 1.35 mg/dL Final         Failed - K in normal range and within 180 days    Potassium  Date Value Ref Range Status  08/07/2022 3.0 (L) 3.5 - 5.3 mmol/L Final         Failed - Na in normal range and within 180 days    Sodium  Date Value Ref Range Status  08/07/2022 129 (L) 135 - 146 mmol/L Final         Failed - Last BP in normal range    BP Readings from Last 1 Encounters:  09/17/22 (!) 152/80         Failed - Valid encounter within last 6 months    Recent Outpatient Visits           6 months ago Hypertension, unspecified type   [facility], DO   8 months ago COPD exacerbation [facility])   [facility] Danelle Berry, PA-C   11 months ago Resistant hypertension   [facility] Mail, DO   1 year ago Hypertension, unspecified type   [facility], DO   1 year ago Hypertension, unspecified type   [facility] Margarita Mail, [place]

## 2023-03-21 ENCOUNTER — Ambulatory Visit: Payer: Medicaid Other | Admitting: Pulmonary Disease

## 2023-03-26 ENCOUNTER — Other Ambulatory Visit: Payer: Self-pay | Admitting: Internal Medicine

## 2023-03-26 DIAGNOSIS — I1A0 Resistant hypertension: Secondary | ICD-10-CM

## 2023-03-27 NOTE — Telephone Encounter (Signed)
Requested medication (s) are due for refill today - yes  Requested medication (s) are on the active medication list -yes  Future visit scheduled -yes  Last refill: 02/12/23 #30   Notes to clinic: Call to patient- appointment is scheduled- patient request RF until appointment- he has had courtesy RF-so forwarded request for review   Requested Prescriptions  Pending Prescriptions Disp Refills   amLODipine (NORVASC) 10 MG tablet [Pharmacy Med Name: AMLODIPINE BESYLATE 10MG  TABLETS] 30 tablet 0    Sig: TAKE 1 TABLET(10 MG) BY MOUTH DAILY     Cardiovascular: Calcium Channel Blockers 2 Failed - 03/27/2023 11:18 AM      Failed - Last BP in normal range    BP Readings from Last 1 Encounters:  09/17/22 (!) 152/80         Failed - Valid encounter within last 6 months    Recent Outpatient Visits           7 months ago Hypertension, unspecified type   [facility] Mail, DO   9 months ago COPD exacerbation [facility])   [place] [facility] Danelle Berry, PA-C   1 year ago Resistant hypertension   [facility]ita Mail, DO   1 year ago Hypertension, unspecified type   [facility] Mail, DO   1 year ago Hypertension, unspecified type   [facility] Margarita Mail, DO       Future Appointments             In 1 week Salena Saner, MD [facility] at [facility] - Last Heart Rate in normal range    Pulse Readings from Last 1 Encounters:  09/17/22 97          chlorthalidone (HYGROTON) 25 MG tablet [Pharmacy Med Name: CHLORTHALIDONE 25MG  TABLETS] 30 tablet 0    Sig: TAKE 1 TABLET(25 MG) BY MOUTH DAILY     Cardiovascular: Diuretics - Thiazide Failed - 03/27/2023 11:18 AM      Failed - Cr in normal range and within 180 days    Creat  Date Value Ref Range Status  08/07/2022 1.03 0.70  - 1.35 mg/dL Final         Failed - K in normal range and within 180 days    Potassium  Date Value Ref Range Status  08/07/2022 3.0 (L) 3.5 - 5.3 mmol/L Final         Failed - Na in normal range and within 180 days    Sodium  Date Value Ref Range Status  08/07/2022 129 (L) 135 - 146 mmol/L Final         Failed - Last BP in normal range    BP Readings from Last 1 Encounters:  09/17/22 (!) 152/80         Failed - Valid encounter within last 6 months    Recent Outpatient Visits           7 months ago Hypertension, unspecified type   [facility] Margarita Mail, DO   9 months ago COPD exacerbation [facility])   [facility] Danelle Berry, PA-C   1 year ago Resistant hypertension   [facility] Margarita Mail, DO   1 year ago Hypertension, unspecified type   [facility] Margarita Mail, [place]  1 year ago Hypertension, unspecified type   [facility] Margarita Mail, DO       Future Appointments             In 1 week Salena Saner, MD [facility] at [facility]               Requested Prescriptions  Pending Prescriptions Disp Refills   amLODipine (NORVASC) 10 MG tablet [Pharmacy Med Name: AMLODIPINE BESYLATE 10MG  TABLETS] 30 tablet 0    Sig: TAKE 1 TABLET(10 MG) BY MOUTH DAILY     Cardiovascular: Calcium Channel Blockers 2 Failed - 03/27/2023 11:18 AM      Failed - Last BP in normal range    BP Readings from Last 1 Encounters:  09/17/22 (!) 152/80         Failed - Valid encounter within last 6 months    Recent Outpatient Visits           7 months ago Hypertension, unspecified type   [facility] Mail, DO   9 months ago COPD exacerbation [facility])   [facility] Danelle Berry, PA-C   1 year ago Resistant hypertension   [place]  [facility] Margarita Mail, DO   1 year ago Hypertension, unspecified type   [facility] Marg[facility]ita Mail, DO   1 year ago Hypertension, unspecified type   [facility] Margarita Mail, DO       Future Appointments             In 1 week Salena Saner, MD [facility] at [facility] - Last Heart Rate in normal range    Pulse Readings from Last 1 Encounters:  09/17/22 97          chlorthalidone (HYGROTON) 25 MG tablet [Pharmacy Med Name: CHLORTHALIDONE 25MG  TABLETS] 30 tablet 0    Sig: TAKE 1 TABLET(25 MG) BY MOUTH DAILY     Cardiovascular: Diuretics - Thiazide Failed - 03/27/2023 11:18 AM      Failed - Cr in normal range and within 180 days    Creat  Date Value Ref Range Status  08/07/2022 1.03 0.70 - 1.35 mg/dL Final         Failed - K in normal range and within 180 days    Potassium  Date Value Ref Range Status  08/07/2022 3.0 (L) 3.5 - 5.3 mmol/L Final         Failed - Na in normal range and within 180 days    Sodium  Date Value Ref Range Status  08/07/2022 129 (L) 135 - 146 mmol/L Final         Failed - Last BP in normal range    BP Readings from Last 1 Encounters:  09/17/22 (!) 152/80         Failed - Valid encounter within last 6 months    Recent Outpatient Visits           7 months ago Hypertension, unspecified type   [facility], DO   9 months ago COPD exacerbation [facility])   [facility] Danelle Berry, PA-C   1 year ago Resistant hypertension   [facility] Margarita Mail, DO   1 year ago Hypertension, unspecified type   [place]  [facility] Mail, DO   1 year ago Hypertension, unspecified type   [facility] Mail, DO       Future Appointments              In 1 week Salena Saner, MD [facility] at [facility]

## 2023-04-01 ENCOUNTER — Other Ambulatory Visit: Payer: Self-pay | Admitting: Internal Medicine

## 2023-04-01 DIAGNOSIS — I1A0 Resistant hypertension: Secondary | ICD-10-CM

## 2023-04-01 DIAGNOSIS — I10 Essential (primary) hypertension: Secondary | ICD-10-CM

## 2023-04-01 NOTE — Telephone Encounter (Signed)
 Copied from CRM [id number]. Topic: Clinical - Medication Refill >> Apr 01, 2023 12:20 PM Elle L wrote: Most Recent Primary Care Visit:  Provider: Margarita Mail  Department: [facility]  Visit Type: OFFICE VISIT  Date: 08/07/2022  Medication: chlorthalidone (HYGROTON) 25 MG tablet AND amLODipine (NORVASC) 10 MG tablet AND hydrALAZINE (APRESOLINE) 10 MG tablet  Has the patient contacted their pharmacy? Yes  Is this the correct pharmacy for this prescription? Yes If no, delete pharmacy and type the correct one.  This is the patient's preferred pharmacy:  [id number] - [place], [place] - [address] AT [facility] [address] [place] [address] [place] [address] Phone: [phone] Fax: [phone]   Has the prescription been filled recently? No  Is the patient out of the medication? No  Has the patient been seen for an appointment in the last year OR does the patient have an upcoming appointment? Yes  Can we respond through MyChart? No  Agent: Please be advised that Rx refills may take up to 3 business days. We ask that you follow-up with your pharmacy.

## 2023-04-02 NOTE — Telephone Encounter (Signed)
 Requested medication (s) are due for refill today: na   Requested medication (s) are on the active medication list: yes   Last refill:  norvasc- 02/12/23 #30 0 refills , hydralazine-02/08/23 #90 0 refills, hygroton-02/26/23 #30 0 refills  Future visit scheduled: yes 04/24/23  Notes to clinic:  do you want to give another courtesy refill?     Requested Prescriptions  Pending Prescriptions Disp Refills   amLODipine (NORVASC) 10 MG tablet 30 tablet 0     Cardiovascular: Calcium Channel Blockers 2 Failed - 04/02/2023  3:17 PM      Failed - Last BP in normal range    BP Readings from Last 1 Encounters:  09/17/22 (!) 152/80         Failed - Valid encounter within last 6 months    Recent Outpatient Visits           7 months ago Hypertension, unspecified type   [facility], DO   9 months ago COPD exacerbation [facility])   [place] [facility] Danelle Berry, PA-C   1 year ago Resistant hypertension   [place] [facility] Mail, DO   1 year ago Hypertension, unspecified type   [facility] Mail, DO   1 year ago Hypertension, unspecified type   [facility] Margarita Mail, DO       Future Appointments             In 3 days Salena Saner, MD [facility] - Last Heart Rate in normal range    Pulse Readings from Last 1 Encounters:  09/17/22 97          hydrALAZINE (APRESOLINE) 10 MG tablet 90 tablet 0     Cardiovascular:  Vasodilators Failed - 04/02/2023  3:17 PM      Failed - ANA Screen, Ifa, Serum in normal range and within 360 days    No results found for: "ANA", "ANATITER", "LABANTI"       Failed - Last BP in normal range    BP Readings from Last 1 Encounters:  09/17/22 (!) 152/80         Passed - HCT in normal range and within 360 days    HCT  Date  Value Ref Range Status  08/07/2022 42.3 38.5 - 50.0 % Final         Passed - HGB in normal range and within 360 days    Hemoglobin  Date Value Ref Range Status  08/07/2022 15.3 13.2 - 17.1 g/dL Final         Passed - RBC in normal range and within 360 days    RBC  Date Value Ref Range Status  08/07/2022 4.27 4.20 - 5.80 Million/uL Final         Passed - WBC in normal range and within 360 days    WBC  Date Value Ref Range Status  08/07/2022 10.1 3.8 - 10.8 Thousand/uL Final         Passed - PLT in normal range and within 360 days    Platelets  Date Value Ref Range Status  08/07/2022 358 140 - 400 Thousand/uL Final         Passed - Valid encounter within last 12 months    Recent Outpatient Visits  7 months ago Hypertension, unspecified type   [facility] Marg[facility]ita Mail, DO   9 months ago COPD exacerbation [facility])   [facility] Danelle Berry, PA-C   1 year ago Resistant hypertension   [place] [facility] Margarita Mail, DO   1 year ago Hypertension, unspecified type   [facility] Margarita Mail, DO   1 year ago Hypertension, unspecified type   [facility] Margarita Mail, DO       Future Appointments             In 3 days Salena Saner, MD [facility] [place] [facility]             chlorthalidone (HYGROTON) 25 MG tablet 30 tablet 0    Sig: TAKE 1 TABLET(25 MG) BY MOUTH DAILY     Cardiovascular: Diuretics - Thiazide Failed - 04/02/2023  3:17 PM      Failed - Cr in normal range and within 180 days    Creat  Date Value Ref Range Status  08/07/2022 1.03 0.70 - 1.35 mg/dL Final         Failed - K in normal range and within 180 days    Potassium  Date Value Ref Range Status  08/07/2022 3.0 (L) 3.5 - 5.3 mmol/L Final         Failed - Na in normal range and within 180 days    Sodium   Date Value Ref Range Status  08/07/2022 129 (L) 135 - 146 mmol/L Final         Failed - Last BP in normal range    BP Readings from Last 1 Encounters:  09/17/22 (!) 152/80         Failed - Valid encounter within last 6 months    Recent Outpatient Visits           7 months ago Hypertension, unspecified type   [facility] Marg[facility]ita Mail, DO   9 months ago COPD exacerbation [facility])   [facility] Dan[facility] Berry, PA-C   1 year ago Resistant hypertension   [facility] Margarita Mail, DO   1 year ago Hypertension, unspecified type   [facility], DO   1 year ago Hypertension, unspecified type   [facility] Marg[facility]ita Mail, DO       Future Appointments             In 3 days Salena Saner, MD [facility] at [facility]

## 2023-04-05 ENCOUNTER — Ambulatory Visit: Payer: Medicaid Other | Admitting: Pulmonary Disease

## 2023-04-05 ENCOUNTER — Encounter: Payer: Self-pay | Admitting: Pulmonary Disease

## 2023-04-05 VITALS — BP 156/70 | HR 102 | Temp 97.7°F | Ht 71.0 in | Wt 152.8 lb

## 2023-04-05 DIAGNOSIS — J4489 Other specified chronic obstructive pulmonary disease: Secondary | ICD-10-CM

## 2023-04-05 DIAGNOSIS — J439 Emphysema, unspecified: Secondary | ICD-10-CM

## 2023-04-05 DIAGNOSIS — G4736 Sleep related hypoventilation in conditions classified elsewhere: Secondary | ICD-10-CM | POA: Diagnosis not present

## 2023-04-05 DIAGNOSIS — J449 Chronic obstructive pulmonary disease, unspecified: Secondary | ICD-10-CM | POA: Diagnosis not present

## 2023-04-05 DIAGNOSIS — Z148 Genetic carrier of other disease: Secondary | ICD-10-CM

## 2023-04-05 MED ORDER — STIOLTO RESPIMAT 2.5-2.5 MCG/ACT IN AERS: 2.0000 | INHALATION_SPRAY | Freq: Every day | RESPIRATORY_TRACT | 11 refills | Status: DC

## 2023-04-05 MED ORDER — ALBUTEROL SULFATE HFA 108 (90 BASE) MCG/ACT IN AERS
2.0000 | INHALATION_SPRAY | Freq: Four times a day (QID) | RESPIRATORY_TRACT | 3 refills | Status: AC | PRN
Start: 2023-04-05 — End: ?

## 2023-04-05 MED ORDER — STIOLTO RESPIMAT 2.5-2.5 MCG/ACT IN AERS: 2.0000 | INHALATION_SPRAY | Freq: Every day | RESPIRATORY_TRACT | Status: DC

## 2023-04-05 NOTE — Progress Notes (Signed)
 Subjective:    Patient ID: Dakota Blackwell, male    DOB: 1960-10-16, 63 y.o.   MRN: [id number]  Patient Care Team: Margarita Mail, DO as PCP - General (Internal Medicine) Salena Saner, MD as Consulting Physician (Pulmonary Disease)  Chief Complaint  Patient presents with   Follow-up    No cough or wheezing. Shortness of breath on exertion.     BACKGROUND/INTERVAL: 63 year old remote former smoker presents for follow-up on the issue of shortness of breath and for COPD.  He has an alpha-1 phenotype MS with normal alpha-1 levels levels.  He was last seen here on 17 September 2022.  HPI Discussed the use of AI scribe software for clinical note transcription with the patient, who gave verbal consent to proceed.  History of Present Illness   Dakota Blackwell is a 63 year old male with severe COPD who presents for follow-up.  He has severe COPD with persistent shortness of breath that remains unchanged. He continues to use his current inhaler, Stiolto, and oxygen therapy nocturnally, which he finds more effective than previous treatments. He uses the oxygen therapy for about two hours at night, which he states is helping.  He does not currently have an emergency inhaler and needs one. He is using Stiolto, taking two puffs in the morning, and finds it effective. He requests a refill of this medication as it is working well for him.  He mentions taking blood pressure medications but is uncertain about the dosage and combination he should be using. He plans to contact Dr. Caralee Ates, his primary care physician, to clarify whether he should continue with the current regimen or adjust it, as he is concerned about taking too many medications at once.    DATA 04/25/2021 PFTs: FEV1 0.71 L or 21% predicted, FVC 2.18 L or 52% predicted, FEV1/FVC 32%, no bronchodilator response.  Lung volumes mildly reduced likely due to air trapping normalized postbronchodilator.  Diffusion capacity  severely decreased.  Consistent with COPD on the basis of emphysema. 05/01/2021 chest x-ray PA and lateral: Increased lung volumes with emphysematous changes.  No other abnormalities. 07/10/2021 alpha 1 antitrypsin: Phenotype MS, level: 154 mg/dL 40/98/1191 ambulatory oximetry: Only able to ambulate 2 laps, O2 desats to 90% but no further.  Review of Systems A 10 point review of systems was performed and it is as noted above otherwise negative.   Patient Active Problem List   Diagnosis Date Noted   Moderate asthma 05/11/2021   Hypertension 04/13/2021   Allergic rhinitis 04/10/2021   History of tobacco use 02/13/2021   Shortness of breath 02/13/2021    Social History   Tobacco Use   Smoking status: Former    Current packs/day: 0.00    Average packs/day: 0.5 packs/day for 15.0 years (7.5 ttl pk-yrs)    Types: Cigarettes    Start date: 17    Quit date: 2013    Years since quitting: 12.1   Smokeless tobacco: Never  Substance Use Topics   Alcohol use: Never    Allergies  Allergen Reactions   Ace Inhibitors Swelling   Current Meds  Medication Sig   albuterol (VENTOLIN HFA) 108 (90 Base) MCG/ACT inhaler INHALE 2 PUFFS INTO THE LUNGS EVERY 6 HOURS AS NEEDED FOR WHEEZING OR SHORTNESS OF BREATH   amLODipine (NORVASC) 10 MG tablet TAKE 1 TABLET(10 MG) BY MOUTH DAILY   Blood Pressure Monitoring (ADULT BLOOD PRESSURE CUFF LG) KIT 1 each by Does not apply route daily.   chlorthalidone (HYGROTON)  25 MG tablet TAKE 1 TABLET(25 MG) BY MOUTH DAILY   fluticasone (FLONASE) 50 MCG/ACT nasal spray Place 2 sprays into both nostrils daily.   hydrALAZINE (APRESOLINE) 10 MG tablet TAKE 1 TABLET BY MOUTH DAILY AS NEEDED. TAKE IF BLOOD PRESSURE IS GREATER THAN 150/90   ipratropium-albuterol (DUONEB) 0.5-2.5 (3) MG/3ML SOLN Take 3 mLs by nebulization 3 (three) times daily as needed (SOB, cough, wheeze).   metoprolol succinate (TOPROL-XL) 100 MG 24 hr tablet Take 1 tablet (100 mg total) by mouth  daily. Take with or immediately following a meal.   Respiratory Therapy Supplies (NEBULIZER/TUBING/MOUTHPIECE) KIT Disp one nebulizer machine, tubing set and mouthpiece kit   Tiotropium Bromide-Olodaterol (STIOLTO RESPIMAT) 2.5-2.5 MCG/ACT AERS Inhale 2 puffs into the lungs daily.    Immunization History  Administered Date(s) Administered   Fluzone Influenza virus vaccine,trivalent (IIV3), split virus 12/06/2022   Influenza,inj,Quad PF,6+ Mos 12/30/2019, 12/26/2020, 11/02/2021   Janssen (J&J) SARS-COV-2 Vaccination 05/18/2019, 12/30/2019   Pfizer Covid-19 Vaccine Bivalent Booster 35yrs & up 12/26/2020   Pfizer(Comirnaty)Fall Seasonal Vaccine 12 years and older 12/08/2021, 12/06/2022        Objective:     BP (!) 156/70 (BP Location: Right Arm, Patient Position: Sitting, Cuff Size: Normal)   Pulse (!) 102   Temp 97.7 F (36.5 C) (Temporal)   Ht 5\' 11"  (1.803 m)   Wt 152 lb 12.8 oz (69.3 kg)   SpO2 95%   BMI 21.31 kg/m   SpO2: 95 %  GENERAL: Well-developed, well-nourished gentleman, no acute distress.  No use of accessories today. Fully ambulatory.  No conversational dyspnea. HEAD: Normocephalic, atraumatic.  EYES: Pupils equal, round, reactive to light.  No scleral icterus.  MOUTH: Poor dentition, oral mucosa moist.  No thrush. NECK: Supple. No thyromegaly. Trachea midline. No JVD.  No adenopathy. PULMONARY: Symmetrical breath sounds, very distant breath sounds, poor air movement, no adventitious sounds.   CARDIOVASCULAR: S1 and S2.  Regular rate and rhythm, no rubs murmurs or gallops heard. ABDOMEN: Benign. MUSCULOSKELETAL: No joint deformity, no clubbing, no edema.  NEUROLOGIC: No overt focal deficit, no gait disturbance, speech is fluent. SKIN: Intact,warm,dry. PSYCH: Normal mood, behavior normal  Ambulatory oxymetry was performed today:  At rest on room air oxygen saturation was 95%, the patient ambulated at a moderate pace, completed 3 laps, O2 nadir 91%, moderate  shortness of breath.  Resting heart rate was 103 bpm and at maximum for this exercise it was 120 bpm.   Assessment & Plan:     ICD-10-CM   1. Stage 4 very severe COPD by GOLD classification (HCC)  J44.9     2. Asthma-COPD overlap syndrome (HCC)  J44.89     3. Alpha 1-antitrypsin PiMS phenotype  Z14.8     4. Nocturnal hypoxemia due to emphysema (HCC)  J43.9    G47.36      Meds ordered this encounter  Medications   Tiotropium Bromide-Olodaterol (STIOLTO RESPIMAT) 2.5-2.5 MCG/ACT AERS    Sig: Inhale 2 puffs into the lungs daily.    Dispense:  2 each    Lot Number?:   [id number]    Expiration Date?:   06/05/2024    NDC:   [phone] [[id number]]    Quantity:   2   Tiotropium Bromide-Olodaterol (STIOLTO RESPIMAT) 2.5-2.5 MCG/ACT AERS    Sig: Inhale 2 puffs into the lungs daily.    Dispense:  4 g    Refill:  11   albuterol (VENTOLIN HFA) 108 (90 Base) MCG/ACT inhaler    Sig:  Inhale 2 puffs into the lungs every 6 (six) hours as needed for wheezing or shortness of breath.    Dispense:  6.7 g    Refill:  3   Assessment and Plan    Chronic Obstructive Pulmonary Disease (COPD) Severe COPD with persistent dyspnea. Current treatment with Stiolto inhaler (once daily) and nocturnal oxygen therapy is more effective than previous treatments. He requires a new prescription for an emergency inhaler. A repeat pulmonary function test is scheduled to assess stability and adjust treatment as needed. - Order Stiolto inhaler - Order albuterol inhaler - Provide samples of Stiolto - Schedule repeat pulmonary function test  Hypertension On multiple antihypertensive medications but unsure about the correct dosage and combination. He has an upcoming appointment with his primary care provider to clarify his medication regimen and ensure proper dosing to avoid potential overmedication. - Advise to contact primary care provider to clarify antihypertensive medication regimen - Follow up with primary care  provider on April 22, 2023 as scheduled.  Follow-up - Follow up in 3 months.      Advised if symptoms do not improve or worsen, to please contact office for sooner follow up or seek emergency care.    I spent 30 minutes of dedicated to the care of this patient on the date of this encounter to include pre-visit review of records, face-to-face time with the patient discussing conditions above, post visit ordering of testing, clinical documentation with the electronic health record, making appropriate referrals as documented, and communicating necessary findings to members of the patients care team.    C. Danice Goltz, MD Advanced Bronchoscopy PCCM [facility]    *This note was generated using voice recognition software/Dragon and/or AI transcription program.  Despite best efforts to proofread, errors can occur which can change the meaning. Any transcriptional errors that result from this process are unintentional and may not be fully corrected at the time of dictation.

## 2023-04-05 NOTE — Patient Instructions (Signed)
 VISIT SUMMARY:  Today, we discussed your severe COPD and hypertension. You mentioned that your current inhaler and oxygen therapy are effective, but you need a new emergency inhaler. We also talked about your blood pressure medications and the need to clarify your regimen with your primary care provider.  YOUR PLAN:  -CHRONIC OBSTRUCTIVE PULMONARY DISEASE (COPD): COPD is a chronic lung condition that makes it hard to breathe. You will continue using your Stiolto inhaler twice daily and nocturnal oxygen therapy. We will also provide you with a new prescription for an emergency inhaler (albuterol) and samples of Stiolto. A repeat pulmonary function test will be scheduled to monitor your condition.  -HYPERTENSION: Hypertension is high blood pressure. You are currently on multiple medications for this condition but are unsure about the correct dosage. Please contact your primary care provider to clarify your medication regimen. You have a follow-up appointment with them on April 22, 2023.  INSTRUCTIONS:  Please follow up in 3 months. Additionally, ensure you contact your primary care provider to clarify your blood pressure medication regimen and attend your scheduled appointment on April 22, 2023.

## 2023-04-24 ENCOUNTER — Ambulatory Visit: Payer: Medicaid Other | Admitting: Internal Medicine

## 2023-05-03 ENCOUNTER — Other Ambulatory Visit: Payer: Self-pay

## 2023-05-03 ENCOUNTER — Encounter: Payer: Self-pay | Admitting: Internal Medicine

## 2023-05-03 ENCOUNTER — Ambulatory Visit: Admitting: Internal Medicine

## 2023-05-03 VITALS — BP 162/88 | HR 98 | Temp 97.8°F | Resp 16 | Ht 71.0 in | Wt 155.7 lb

## 2023-05-03 DIAGNOSIS — J45909 Unspecified asthma, uncomplicated: Secondary | ICD-10-CM

## 2023-05-03 DIAGNOSIS — I10 Essential (primary) hypertension: Secondary | ICD-10-CM | POA: Diagnosis not present

## 2023-05-03 DIAGNOSIS — I1A0 Resistant hypertension: Secondary | ICD-10-CM

## 2023-05-03 MED ORDER — METOPROLOL SUCCINATE ER 100 MG PO TB24: 100.0000 mg | ORAL_TABLET | Freq: Every day | ORAL | 1 refills | Status: AC

## 2023-05-03 MED ORDER — CHLORTHALIDONE 25 MG PO TABS: ORAL_TABLET | ORAL | 1 refills | Status: DC

## 2023-05-03 MED ORDER — HYDRALAZINE HCL 10 MG PO TABS: 10.0000 mg | ORAL_TABLET | Freq: Every day | ORAL | 1 refills | Status: DC

## 2023-05-03 MED ORDER — AMLODIPINE BESYLATE 10 MG PO TABS: 10.0000 mg | ORAL_TABLET | Freq: Every day | ORAL | 1 refills | Status: DC

## 2023-05-03 NOTE — Assessment & Plan Note (Signed)
 Stable, doing well on inhalers.

## 2023-05-03 NOTE — Assessment & Plan Note (Signed)
 BP elevated, has been out of all medications for 1 week. Refill everything, recheck at follow up.

## 2023-05-03 NOTE — Patient Instructions (Signed)
 It was great seeing you today!  Plan discussed at today's visit: -For blood pressure: take Chlorthalidone 25 mg, Metoprolol 100 mg and Hydralazine 10 mg in the morning then take Amlodipine 10 mg in the evening -Continue to monitor blood pressure pressure at home -Plan for fasting labs at follow up   Follow up in: 4 months   Take care and let us know if you have any questions or concerns prior to your next visit.  Dr. Caralee Ates

## 2023-05-03 NOTE — Progress Notes (Signed)
 Established Office Visit  Subjective:    Patient ID: Dakota Blackwell, male    DOB: May 14, 1960, 63 y.o.   MRN: [id number]  Chief Complaint  Patient presents with   Medical Management of Chronic Issues    HPI Patient is in today for follow up on blood pressure but he has been out of his medications for about 1 week.  Hypertension: -Medications: Amlodipine 10 mg, Chlorothiazide 25 mg, Metoprolol 100 mg XL and Hydralazine 10 mg daily  Medications Failed: ACEI caused facial swelling, HCTZ caused rash -Checking BP at home (average): not checking, does not have a working cuff -Denies any SOB, CP, vision changes, LE edema or symptoms of hypotension. New medication without side effects  -Diet: working on cutting down in sodium and has completely cut out alcohol  -Exercise: no routine but likes to walk  Severe COPD: -Following with Pulmonology -COPD status: exacerbated -Current Treatments: Spirva, Albuterol PRN -Satisfied with current treatment?: yes -Influenza: Up to Date, politely declines Pneumonia vaccine   History of Elevated LFTs: -AST had been elevated to 41, ALT normal, Na 132 on CMP 3/23.  On recheck labs from 05/11/2021 liver enzymes returned back to normal, AST 23, ALT 14.  Total bilirubin slightly elevated at 1.3 and sodium slightly low at 133 -MCV elevated as well on CBC to 101.1, recheck at 100.7.  Vitamin B12, folate and thiamine all within normal limits. -Had been drinking multiple beers a day most days but has stopped drinking alcohol completely since our last visit, continues to maintain this. -Improved on labs in 7/24  Health Maintenance: -Blood work UTD -Colon cancer screening due, had an issue getting the Cologuard delivered -Continues to decline prevnar 20   Family History  Problem Relation Age of Onset   COPD Father    Cancer Father        bone    Social History   Socioeconomic History   Marital status: Single    Spouse name: Not on file    Number of children: Not on file   Years of education: Not on file   Highest education level: Not on file  Occupational History   Not on file  Tobacco Use   Smoking status: Former    Current packs/day: 0.00    Average packs/day: 0.5 packs/day for 15.0 years (7.5 ttl pk-yrs)    Types: Cigarettes    Start date: 67    Quit date: 2013    Years since quitting: 12.2   Smokeless tobacco: Never  Vaping Use   Vaping status: Never Used  Substance and Sexual Activity   Alcohol use: Never   Drug use: Never   Sexual activity: Not on file  Other Topics Concern   Not on file  Social History Narrative   Not on file   Social Drivers of Health   Financial Resource Strain: Low Risk  (04/13/2021)   Overall Financial Resource Strain (CARDIA)    Difficulty of Paying Living Expenses: Not hard at all  Food Insecurity: No Food Insecurity (04/13/2021)   Hunger Vital Sign    Worried About Running Out of Food in the Last Year: Never true    Ran Out of Food in the Last Year: Never true  Transportation Needs: No Transportation Needs (04/13/2021)   PRAPARE - Administrator, Civil Service (Medical): No    Lack of Transportation (Non-Medical): No  Physical Activity: Inactive (04/13/2021)   Exercise Vital Sign    Days of Exercise per Week: 0  days    Minutes of Exercise per Session: 0 min  Stress: No Stress Concern Present (04/13/2021)   [employer] of Occupational Health - Occupational Stress Questionnaire    Feeling of Stress : Not at all  Social Connections: Socially Isolated (04/13/2021)   Social Connection and Isolation Panel [NHANES]    Frequency of Communication with Friends and Family: Once a week    Frequency of Social Gatherings with Friends and Family: Once a week    Attends Religious Services: Never    Database administrator or Organizations: No    Attends Banker Meetings: Never    Marital Status: Never married  Intimate Partner Violence: Not At Risk (04/13/2021)    Humiliation, Afraid, Rape, and Kick questionnaire    Fear of Current or Ex-Partner: No    Emotionally Abused: No    Physically Abused: No    Sexually Abused: No    Outpatient Medications Prior to Visit  Medication Sig Dispense Refill   albuterol (VENTOLIN HFA) 108 (90 Base) MCG/ACT inhaler Inhale 2 puffs into the lungs every 6 (six) hours as needed for wheezing or shortness of breath. 6.7 g 3   fluticasone (FLONASE) 50 MCG/ACT nasal spray Place 2 sprays into both nostrils daily. 16 g 6   ipratropium-albuterol (DUONEB) 0.5-2.5 (3) MG/3ML SOLN Take 3 mLs by nebulization 3 (three) times daily as needed (SOB, cough, wheeze). 180 mL 1   Tiotropium Bromide-Olodaterol (STIOLTO RESPIMAT) 2.5-2.5 MCG/ACT AERS Inhale 2 puffs into the lungs daily. 2 each    amLODipine (NORVASC) 10 MG tablet TAKE 1 TABLET(10 MG) BY MOUTH DAILY 30 tablet 0   chlorthalidone (HYGROTON) 25 MG tablet TAKE 1 TABLET(25 MG) BY MOUTH DAILY 30 tablet 0   hydrALAZINE (APRESOLINE) 10 MG tablet TAKE 1 TABLET BY MOUTH DAILY AS NEEDED. TAKE IF BLOOD PRESSURE IS GREATER THAN 150/90 90 tablet 0   metoprolol succinate (TOPROL-XL) 100 MG 24 hr tablet Take 1 tablet (100 mg total) by mouth daily. Take with or immediately following a meal. 90 tablet 1   Blood Pressure Monitoring (ADULT BLOOD PRESSURE CUFF LG) KIT 1 each by Does not apply route daily. (Patient not taking: Reported on 05/03/2023) 1 kit 0   Respiratory Therapy Supplies (NEBULIZER/TUBING/MOUTHPIECE) KIT Disp one nebulizer machine, tubing set and mouthpiece kit (Patient not taking: Reported on 05/03/2023) 1 kit 0   Tiotropium Bromide-Olodaterol (STIOLTO RESPIMAT) 2.5-2.5 MCG/ACT AERS Inhale 2 puffs into the lungs daily. (Patient not taking: Reported on 05/03/2023) 4 g 11   No facility-administered medications prior to visit.    Allergies  Allergen Reactions   Ace Inhibitors Swelling    Review of Systems  All other systems reviewed and are negative.      Objective:     Physical Exam Constitutional:      Appearance: Normal appearance.  HENT:     Head: Normocephalic and atraumatic.     Mouth/Throat:     Mouth: Mucous membranes are moist.     Pharynx: Oropharynx is clear.  Eyes:     Extraocular Movements: Extraocular movements intact.     Conjunctiva/sclera: Conjunctivae normal.     Pupils: Pupils are equal, round, and reactive to light.  Cardiovascular:     Rate and Rhythm: Normal rate and regular rhythm.  Pulmonary:     Effort: Pulmonary effort is normal.     Breath sounds: No wheezing or rales.     Comments: Decreased air movement throughout  Musculoskeletal:     Right lower  leg: No edema.     Left lower leg: No edema.  Skin:    General: Skin is warm and dry.     Comments:    Neurological:     General: No focal deficit present.     Mental Status: He is alert. Mental status is at baseline.  Psychiatric:        Mood and Affect: Mood normal.        Behavior: Behavior normal.     BP (!) 162/88   Pulse 98   Temp 97.8 F (36.6 C) (Oral)   Resp 16   Ht 5\' 11"  (1.803 m)   Wt 155 lb 11.2 oz (70.6 kg)   SpO2 98%   BMI 21.72 kg/m   Vitals:   05/03/23 1047 05/03/23 1104  BP: (!) 164/86 (!) 162/88    Wt Readings from Last 3 Encounters:  05/03/23 155 lb 11.2 oz (70.6 kg)  04/05/23 152 lb 12.8 oz (69.3 kg)  09/17/22 147 lb 9.6 oz (67 kg)   Vitals:   05/03/23 1047 05/03/23 1104  BP: (!) 164/86 (!) 162/88    There are no preventive care reminders to display for this patient.   There are no preventive care reminders to display for this patient.   No results found for: "TSH" Lab Results  Component Value Date   WBC 10.1 08/07/2022   HGB 15.3 08/07/2022   HCT 42.3 08/07/2022   MCV 99.1 08/07/2022   PLT 358 08/07/2022   Lab Results  Component Value Date   NA 129 (L) 08/07/2022   K 3.0 (L) 08/07/2022   CO2 35 (H) 08/07/2022   GLUCOSE 81 08/07/2022   BUN 11 08/07/2022   CREATININE 1.03 08/07/2022   BILITOT 1.5 (H)  08/07/2022   AST 20 08/07/2022   ALT 9 08/07/2022   PROT 7.7 08/07/2022   CALCIUM 9.5 08/07/2022   EGFR 82 08/07/2022   Lab Results  Component Value Date   CHOL 169 08/07/2022   Lab Results  Component Value Date   HDL 87 08/07/2022   Lab Results  Component Value Date   LDLCALC 68 08/07/2022   Lab Results  Component Value Date   TRIG 48 08/07/2022   Lab Results  Component Value Date   CHOLHDL 1.9 08/07/2022   No results found for: "HGBA1C"     Assessment & Plan:   Hypertension, unspecified type Assessment & Plan: BP elevated, has been out of all medications for 1 week. Refill everything, recheck at follow up.   Orders: -     Chlorthalidone; TAKE 1 TABLET(25 MG) BY MOUTH DAILY  Dispense: 90 tablet; Refill: 1 -     amLODIPine Besylate; Take 1 tablet (10 mg total) by mouth daily.  Dispense: 90 tablet; Refill: 1 -     hydrALAZINE HCl; Take 1 tablet (10 mg total) by mouth daily.  Dispense: 90 tablet; Refill: 1 -     Metoprolol Succinate ER; Take 1 tablet (100 mg total) by mouth daily. Take with or immediately following a meal.  Dispense: 90 tablet; Refill: 1  Resistant hypertension Assessment & Plan: BP elevated, has been out of all medications for 1 week. Refill everything, recheck at follow up.    Moderate asthma, unspecified whether complicated, unspecified whether persistent Assessment & Plan: Stable, doing well on inhalers.    Follow up in 4 months for BP and labs.   Margarita Mail, DO

## 2023-05-27 ENCOUNTER — Other Ambulatory Visit: Payer: Self-pay | Admitting: Pulmonary Disease

## 2023-05-30 ENCOUNTER — Telehealth: Payer: Self-pay

## 2023-05-30 MED ORDER — STIOLTO RESPIMAT 2.5-2.5 MCG/ACT IN AERS
2.0000 | INHALATION_SPRAY | Freq: Every day | RESPIRATORY_TRACT | 11 refills | Status: AC
Start: 2023-05-30 — End: ?

## 2023-05-30 NOTE — Telephone Encounter (Signed)
 I spoke with the patient. He needs a refill on his Stiolto inhaler.   I have sent in the refill to [facility] and the patient is aware.  Nothing further needed.

## 2023-05-30 NOTE — Telephone Encounter (Signed)
 Copied from CRM [id number]. Topic: General - Other >> May 30, 2023  2:03 PM Eveleen Hinds B wrote: Reason for CRM: Patient called regarding prescription. Patient states our office told him the prescription would be sent over by 1pm and he's call the pharmacy and they state it is not there. Patient would like a call back.

## 2023-07-03 ENCOUNTER — Ambulatory Visit: Payer: Medicaid Other | Admitting: Internal Medicine

## 2023-09-03 ENCOUNTER — Ambulatory Visit: Admitting: Internal Medicine

## 2023-10-14 ENCOUNTER — Other Ambulatory Visit: Payer: Self-pay | Admitting: Internal Medicine

## 2023-10-14 DIAGNOSIS — I10 Essential (primary) hypertension: Secondary | ICD-10-CM

## 2023-10-15 NOTE — Telephone Encounter (Signed)
 Requested Prescriptions  Pending Prescriptions Disp Refills   chlorthalidone  (HYGROTON ) 25 MG tablet [Pharmacy Med Name: CHLORTHALIDONE  25MG  TABLETS] 90 tablet 0    Sig: TAKE 1 TABLET(25 MG) BY MOUTH DAILY     Cardiovascular: Diuretics - Thiazide Failed - 10/15/2023  2:26 PM      Failed - Cr in normal range and within 180 days    Creat  Date Value Ref Range Status  08/07/2022 1.03 0.70 - 1.35 mg/dL Final         Failed - K in normal range and within 180 days    Potassium  Date Value Ref Range Status  08/07/2022 3.0 (L) 3.5 - 5.3 mmol/L Final         Failed - Na in normal range and within 180 days    Sodium  Date Value Ref Range Status  08/07/2022 129 (L) 135 - 146 mmol/L Final         Failed - Last BP in normal range    BP Readings from Last 1 Encounters:  05/03/23 (!) 162/88         Passed - Valid encounter within last 6 months    Recent Outpatient Visits           5 months ago Hypertension, unspecified type   [facility] Bernardo Fend, DO               amLODipine  (NORVASC ) 10 MG tablet [Pharmacy Med Name: AMLODIPINE  BESYLATE 10MG TABLETS] 90 tablet 0    Sig: TAKE 1 TABLET(10 MG) BY MOUTH DAILY     Cardiovascular: Calcium Channel Blockers 2 Failed - 10/15/2023  2:26 PM      Failed - Last BP in normal range    BP Readings from Last 1 Encounters:  05/03/23 (!) 162/88         Passed - Last Heart Rate in normal range    Pulse Readings from Last 1 Encounters:  05/03/23 98         Passed - Valid encounter within last 6 months    Recent Outpatient Visits           5 months ago Hypertension, unspecified type   [facility] Bernardo Fend, DO               hydrALAZINE  (APRESOLINE ) 10 MG tablet [Pharmacy Med Name: HYDRALAZINE  10 MG TABLETS (ORANGE)] 90 tablet 0    Sig: TAKE 1 TABLET(10 MG) BY MOUTH DAILY     Cardiovascular:  Vasodilators Failed - 10/15/2023  2:26 PM      Failed - HCT in normal  range and within 360 days    HCT  Date Value Ref Range Status  08/07/2022 42.3 38.5 - 50.0 % Final         Failed - HGB in normal range and within 360 days    Hemoglobin  Date Value Ref Range Status  08/07/2022 15.3 13.2 - 17.1 g/dL Final         Failed - RBC in normal range and within 360 days    RBC  Date Value Ref Range Status  08/07/2022 4.27 4.20 - 5.80 Million/uL Final         Failed - WBC in normal range and within 360 days    WBC  Date Value Ref Range Status  08/07/2022 10.1 3.8 - 10.8 Thousand/uL Final         Failed - PLT in normal range and within 360 days  Platelets  Date Value Ref Range Status  08/07/2022 358 140 - 400 Thousand/uL Final         Failed - ANA Screen, Ifa, Serum in normal range and within 360 days    No results found for: ANA, ANATITER, LABANTI       Failed - Last BP in normal range    BP Readings from Last 1 Encounters:  05/03/23 (!) 162/88         Passed - Valid encounter within last 12 months    Recent Outpatient Visits           5 months ago Hypertension, unspecified type   [facility] Bernardo Fend, [place]

## 2024-01-31 ENCOUNTER — Other Ambulatory Visit: Payer: Self-pay | Admitting: Internal Medicine

## 2024-01-31 DIAGNOSIS — I10 Essential (primary) hypertension: Secondary | ICD-10-CM

## 2024-02-03 NOTE — Telephone Encounter (Signed)
 Requested medication (s) are due for refill today: yes  Requested medication (s) are on the active medication list: yes  Last refill:  10/15/23  Future visit scheduled: {no  Notes to clinic:  Unable to refill per protocol due to failed labs, no updated results.      Requested Prescriptions  Pending Prescriptions Disp Refills   chlorthalidone  (HYGROTON ) 25 MG tablet [Pharmacy Med Name: CHLORTHALIDONE  25MG  TABLETS] 90 tablet 0    Sig: TAKE 1 TABLET(25 MG) BY MOUTH DAILY     Cardiovascular: Diuretics - Thiazide Failed - 02/03/2024  2:49 PM      Failed - Cr in normal range and within 180 days    Creat  Date Value Ref Range Status  08/07/2022 1.03 0.70 - 1.35 mg/dL Final         Failed - K in normal range and within 180 days    Potassium  Date Value Ref Range Status  08/07/2022 3.0 (L) 3.5 - 5.3 mmol/L Final         Failed - Na in normal range and within 180 days    Sodium  Date Value Ref Range Status  08/07/2022 129 (L) 135 - 146 mmol/L Final         Failed - Last BP in normal range    BP Readings from Last 1 Encounters:  05/03/23 (!) 162/88         Failed - Valid encounter within last 6 months    Recent Outpatient Visits           9 months ago Hypertension, unspecified type   [facility] Bernardo Fend, DO               amLODipine  (NORVASC ) 10 MG tablet [Pharmacy Med Name: AMLODIPINE  BESYLATE 10MG TABLETS] 90 tablet 0    Sig: TAKE 1 TABLET(10 MG) BY MOUTH DAILY     Cardiovascular: Calcium Channel Blockers 2 Failed - 02/03/2024  2:49 PM      Failed - Last BP in normal range    BP Readings from Last 1 Encounters:  05/03/23 (!) 162/88         Failed - Valid encounter within last 6 months    Recent Outpatient Visits           9 months ago Hypertension, unspecified type   [facility] Bernardo Fend, DO              Passed - Last Heart Rate in normal range    Pulse Readings from Last 1  Encounters:  05/03/23 98          hydrALAZINE  (APRESOLINE ) 10 MG tablet [Pharmacy Med Name: HYDRALAZINE  10 MG TABLETS (ORANGE)] 90 tablet 0    Sig: TAKE 1 TABLET(10 MG) BY MOUTH DAILY     Cardiovascular:  Vasodilators Failed - 02/03/2024  2:49 PM      Failed - HCT in normal range and within 360 days    HCT  Date Value Ref Range Status  08/07/2022 42.3 38.5 - 50.0 % Final         Failed - HGB in normal range and within 360 days    Hemoglobin  Date Value Ref Range Status  08/07/2022 15.3 13.2 - 17.1 g/dL Final         Failed - RBC in normal range and within 360 days    RBC  Date Value Ref Range Status  08/07/2022 4.27 4.20 - 5.80 Million/uL Final  Failed - WBC in normal range and within 360 days    WBC  Date Value Ref Range Status  08/07/2022 10.1 3.8 - 10.8 Thousand/uL Final         Failed - PLT in normal range and within 360 days    Platelets  Date Value Ref Range Status  08/07/2022 358 140 - 400 Thousand/uL Final         Failed - ANA Screen, Ifa, Serum in normal range and within 360 days    No results found for: ANA, ANATITER, LABANTI       Failed - Last BP in normal range    BP Readings from Last 1 Encounters:  05/03/23 (!) 162/88         Passed - Valid encounter within last 12 months    Recent Outpatient Visits           9 months ago Hypertension, unspecified type   [facility] Bernardo Fend, [place]

## 2024-02-04 ENCOUNTER — Other Ambulatory Visit: Payer: Self-pay | Admitting: Internal Medicine

## 2024-02-04 DIAGNOSIS — I10 Essential (primary) hypertension: Secondary | ICD-10-CM

## 2024-02-05 NOTE — Telephone Encounter (Signed)
 Requested medication (s) are due for refill today: na   Requested medication (s) are on the active medication list: yes   Last refill:  02/04/24 #30 0 refills  Future visit scheduled: yes 03/02/24  Notes to clinic:  do you want to refill for 90 day supply?      Requested Prescriptions  Pending Prescriptions Disp Refills   chlorthalidone  (HYGROTON ) 25 MG tablet [Pharmacy Med Name: CHLORTHALIDONE  25MG  TABLETS] 90 tablet     Sig: TAKE 1 TABLET(25 MG) BY MOUTH DAILY     Cardiovascular: Diuretics - Thiazide Failed - 02/05/2024  2:16 PM      Failed - Cr in normal range and within 180 days    Creat  Date Value Ref Range Status  08/07/2022 1.03 0.70 - 1.35 mg/dL Final         Failed - K in normal range and within 180 days    Potassium  Date Value Ref Range Status  08/07/2022 3.0 (L) 3.5 - 5.3 mmol/L Final         Failed - Na in normal range and within 180 days    Sodium  Date Value Ref Range Status  08/07/2022 129 (L) 135 - 146 mmol/L Final         Failed - Last BP in normal range    BP Readings from Last 1 Encounters:  05/03/23 (!) 162/88         Failed - Valid encounter within last 6 months    Recent Outpatient Visits           9 months ago Hypertension, unspecified type   [facility] Bernardo Fend, DO               amLODipine  (NORVASC ) 10 MG tablet [Pharmacy Med Name: AMLODIPINE  BESYLATE 10MG TABLETS] 90 tablet     Sig: TAKE 1 TABLET(10 MG) BY MOUTH DAILY     Cardiovascular: Calcium Channel Blockers 2 Failed - 02/05/2024  2:16 PM      Failed - Last BP in normal range    BP Readings from Last 1 Encounters:  05/03/23 (!) 162/88         Failed - Valid encounter within last 6 months    Recent Outpatient Visits           9 months ago Hypertension, unspecified type   [facility] Bernardo Fend, DO              Passed - Last Heart Rate in normal range    Pulse Readings from Last 1  Encounters:  05/03/23 98          hydrALAZINE  (APRESOLINE ) 10 MG tablet [Pharmacy Med Name: HYDRALAZINE  10 MG TABLETS (ORANGE)] 90 tablet     Sig: TAKE 1 TABLET(10 MG) BY MOUTH DAILY     Cardiovascular:  Vasodilators Failed - 02/05/2024  2:16 PM      Failed - HCT in normal range and within 360 days    HCT  Date Value Ref Range Status  08/07/2022 42.3 38.5 - 50.0 % Final         Failed - HGB in normal range and within 360 days    Hemoglobin  Date Value Ref Range Status  08/07/2022 15.3 13.2 - 17.1 g/dL Final         Failed - RBC in normal range and within 360 days    RBC  Date Value Ref Range Status  08/07/2022 4.27 4.20 - 5.80 Million/uL  Final         Failed - WBC in normal range and within 360 days    WBC  Date Value Ref Range Status  08/07/2022 10.1 3.8 - 10.8 Thousand/uL Final         Failed - PLT in normal range and within 360 days    Platelets  Date Value Ref Range Status  08/07/2022 358 140 - 400 Thousand/uL Final         Failed - ANA Screen, Ifa, Serum in normal range and within 360 days    No results found for: ANA, ANATITER, LABANTI       Failed - Last BP in normal range    BP Readings from Last 1 Encounters:  05/03/23 (!) 162/88         Passed - Valid encounter within last 12 months    Recent Outpatient Visits           9 months ago Hypertension, unspecified type   [facility] Bernardo Fend, [place]

## 2024-03-02 ENCOUNTER — Ambulatory Visit: Admitting: Internal Medicine

## 2024-03-17 ENCOUNTER — Ambulatory Visit: Admitting: Internal Medicine
